# Patient Record
Sex: Male | Born: 1970 | Race: Black or African American | Hispanic: No | Marital: Married | Smoking: Never smoker
Health system: Southern US, Community
[De-identification: ages and names within clinical notes are randomized; demographics above are authoritative.]

## PROBLEM LIST (undated history)

## (undated) ENCOUNTER — Ambulatory Visit: Payer: Medicaid (Managed Care)

## (undated) DIAGNOSIS — R7301 Impaired fasting glucose: Secondary | ICD-10-CM

## (undated) DIAGNOSIS — R635 Abnormal weight gain: Secondary | ICD-10-CM

## (undated) DIAGNOSIS — I10 Essential (primary) hypertension: Secondary | ICD-10-CM

## (undated) DIAGNOSIS — E785 Hyperlipidemia, unspecified: Secondary | ICD-10-CM

## (undated) HISTORY — DX: Hyperlipidemia, unspecified: E78.5

## (undated) HISTORY — DX: Abnormal weight gain: R63.5

## (undated) HISTORY — DX: Impaired fasting glucose: R73.01

## (undated) HISTORY — DX: Essential (primary) hypertension: I10

## (undated) HISTORY — PX: CERVICAL SPINE SURGERY: SHX589

---

## 2011-01-11 ENCOUNTER — Emergency Department (HOSPITAL_BASED_OUTPATIENT_CLINIC_OR_DEPARTMENT_OTHER)
Admission: EM | Admit: 2011-01-11 | Discharge: 2011-01-11 | Disposition: A | Discharge status: Home / Self Care | Payer: Managed Care, Other (non HMO) | Attending: Emergency Medicine | Admitting: Emergency Medicine

## 2011-01-11 ENCOUNTER — Emergency Department (INDEPENDENT_AMBULATORY_CARE_PROVIDER_SITE_OTHER): Payer: Managed Care, Other (non HMO)

## 2011-01-11 DIAGNOSIS — J90 Pleural effusion, not elsewhere classified: Secondary | ICD-10-CM

## 2011-01-11 DIAGNOSIS — I10 Essential (primary) hypertension: Secondary | ICD-10-CM | POA: Insufficient documentation

## 2011-01-11 DIAGNOSIS — Q619 Cystic kidney disease, unspecified: Secondary | ICD-10-CM | POA: Insufficient documentation

## 2011-01-11 DIAGNOSIS — R109 Unspecified abdominal pain: Secondary | ICD-10-CM

## 2011-01-11 DIAGNOSIS — K8020 Calculus of gallbladder without cholecystitis without obstruction: Secondary | ICD-10-CM | POA: Insufficient documentation

## 2011-01-11 DIAGNOSIS — R197 Diarrhea, unspecified: Secondary | ICD-10-CM

## 2011-01-11 LAB — CBC
HCT: 39 % (ref 39.0–52.0)
MCV: 83.7 fL (ref 78.0–100.0)
Platelets: 212 10*3/uL (ref 150–400)
RBC: 4.66 MIL/uL (ref 4.22–5.81)
WBC: 7 10*3/uL (ref 4.0–10.5)

## 2011-01-11 LAB — COMPREHENSIVE METABOLIC PANEL
ALT: 30 U/L (ref 0–53)
AST: 30 U/L (ref 0–37)
Albumin: 4.4 g/dL (ref 3.5–5.2)
Alkaline Phosphatase: 70 U/L (ref 39–117)
GFR calc Af Amer: >60 mL/min (ref >60)
Glucose, Bld: 108 mg/dL — ABNORMAL HIGH (ref 70–99)
Potassium: 4.3 mEq/L (ref 3.5–5.1)
Sodium: 142 mEq/L (ref 135–145)
Total Protein: 8.2 g/dL (ref 6.0–8.3)

## 2011-01-11 LAB — DIFFERENTIAL
Lymphocytes Relative: 35 % (ref 12–46)
Lymphs Abs: 2.5 10*3/uL (ref 0.7–4.0)
Neutrophils Relative %: 50 % (ref 43–77)

## 2011-01-11 LAB — URINALYSIS, ROUTINE W REFLEX MICROSCOPIC
Bilirubin Urine: NEGATIVE
Hgb urine dipstick: NEGATIVE
Nitrite: NEGATIVE
Specific Gravity, Urine: 1.013 (ref 1.005–1.030)
pH: 6.5 (ref 5.0–8.0)

## 2011-01-11 LAB — LIPASE, BLOOD: Lipase: 71 U/L (ref 23–300)

## 2011-01-13 ENCOUNTER — Observation Stay (HOSPITAL_COMMUNITY)
Admission: AD | Admit: 2011-01-13 | Discharge: 2011-01-15 | Disposition: A | Discharge status: Home / Self Care | Payer: Managed Care, Other (non HMO) | Source: Other Acute Inpatient Hospital | Attending: Internal Medicine | Admitting: Internal Medicine

## 2011-01-13 ENCOUNTER — Emergency Department (HOSPITAL_BASED_OUTPATIENT_CLINIC_OR_DEPARTMENT_OTHER)
Admission: EM | Admit: 2011-01-13 | Discharge: 2011-01-13 | Disposition: A | Discharge status: Other Acute Inpatient Hospital | Payer: Managed Care, Other (non HMO) | Attending: Emergency Medicine | Admitting: Emergency Medicine

## 2011-01-13 ENCOUNTER — Emergency Department (INDEPENDENT_AMBULATORY_CARE_PROVIDER_SITE_OTHER): Payer: Managed Care, Other (non HMO)

## 2011-01-13 DIAGNOSIS — I10 Essential (primary) hypertension: Secondary | ICD-10-CM | POA: Insufficient documentation

## 2011-01-13 DIAGNOSIS — N281 Cyst of kidney, acquired: Secondary | ICD-10-CM

## 2011-01-13 DIAGNOSIS — J189 Pneumonia, unspecified organism: Secondary | ICD-10-CM | POA: Insufficient documentation

## 2011-01-13 DIAGNOSIS — R0602 Shortness of breath: Secondary | ICD-10-CM | POA: Insufficient documentation

## 2011-01-13 DIAGNOSIS — R1013 Epigastric pain: Secondary | ICD-10-CM | POA: Insufficient documentation

## 2011-01-13 DIAGNOSIS — R109 Unspecified abdominal pain: Secondary | ICD-10-CM

## 2011-01-13 DIAGNOSIS — M5126 Other intervertebral disc displacement, lumbar region: Secondary | ICD-10-CM | POA: Insufficient documentation

## 2011-01-13 DIAGNOSIS — N200 Calculus of kidney: Secondary | ICD-10-CM | POA: Insufficient documentation

## 2011-01-13 DIAGNOSIS — Z79899 Other long term (current) drug therapy: Secondary | ICD-10-CM | POA: Insufficient documentation

## 2011-01-13 DIAGNOSIS — K8020 Calculus of gallbladder without cholecystitis without obstruction: Secondary | ICD-10-CM | POA: Insufficient documentation

## 2011-01-13 DIAGNOSIS — J90 Pleural effusion, not elsewhere classified: Secondary | ICD-10-CM | POA: Insufficient documentation

## 2011-01-13 DIAGNOSIS — E871 Hypo-osmolality and hyponatremia: Secondary | ICD-10-CM | POA: Insufficient documentation

## 2011-01-13 DIAGNOSIS — R9431 Abnormal electrocardiogram [ECG] [EKG]: Secondary | ICD-10-CM | POA: Insufficient documentation

## 2011-01-13 LAB — CBC
HCT: 38.6 % — ABNORMAL LOW (ref 39.0–52.0)
MCH: 29.3 pg (ref 26.0–34.0)
MCHC: 35 g/dL (ref 30.0–36.0)
MCV: 83.9 fL (ref 78.0–100.0)
Platelets: 208 10*3/uL (ref 150–400)
RDW: 11.9 % (ref 11.5–15.5)

## 2011-01-13 LAB — URINALYSIS, ROUTINE W REFLEX MICROSCOPIC
Bilirubin Urine: NEGATIVE
Hgb urine dipstick: NEGATIVE
Ketones, ur: NEGATIVE mg/dL
Nitrite: NEGATIVE
Urobilinogen, UA: 0.2 mg/dL (ref 0.0–1.0)

## 2011-01-13 LAB — COMPREHENSIVE METABOLIC PANEL
AST: 30 U/L (ref 0–37)
Albumin: 4.4 g/dL (ref 3.5–5.2)
BUN: 17 mg/dL (ref 6–23)
CO2: 28 mEq/L (ref 19–32)
Calcium: 9.5 mg/dL (ref 8.4–10.5)
Chloride: 107 mEq/L (ref 96–112)
Creatinine, Ser: 1.4 mg/dL (ref 0.4–1.5)
GFR calc Af Amer: >60 mL/min (ref >60)
GFR calc non Af Amer: 56 mL/min — ABNORMAL LOW (ref >60)
Total Bilirubin: 0.7 mg/dL (ref 0.3–1.2)

## 2011-01-13 LAB — POCT CARDIAC MARKERS
Myoglobin, poc: 158 ng/mL (ref 12–200)
Myoglobin, poc: 129 ng/mL (ref 12–200)
Troponin i, poc: <0.05 ng/mL (ref 0.00–0.09)
Troponin i, poc: 0.09 ng/mL (ref 0.00–0.09)

## 2011-01-13 LAB — DIFFERENTIAL
Eosinophils Absolute: 0.3 10*3/uL (ref 0.0–0.7)
Eosinophils Relative: 6 % — ABNORMAL HIGH (ref 0–5)
Lymphocytes Relative: 39 % (ref 12–46)
Lymphs Abs: 1.9 10*3/uL (ref 0.7–4.0)
Monocytes Absolute: 0.8 10*3/uL (ref 0.1–1.0)
Monocytes Relative: 17 % — ABNORMAL HIGH (ref 3–12)

## 2011-01-13 LAB — D-DIMER, QUANTITATIVE: D-Dimer, Quant: 0.45 ug/mL-FEU (ref 0.00–0.48)

## 2011-01-13 LAB — URINE MICROSCOPIC-ADD ON

## 2011-01-13 LAB — POCT TOXICOLOGY PANEL: Opiates: POSITIVE

## 2011-01-13 LAB — CARDIAC PANEL(CRET KIN+CKTOT+MB+TROPI): Troponin I: <0.01 ng/mL (ref 0.00–0.06)

## 2011-01-14 ENCOUNTER — Observation Stay (HOSPITAL_COMMUNITY): Payer: Managed Care, Other (non HMO)

## 2011-01-14 LAB — CARDIAC PANEL(CRET KIN+CKTOT+MB+TROPI)
CK, MB: 2.1 ng/mL (ref 0.3–4.0)
CK, MB: 2 ng/mL (ref 0.3–4.0)
Relative Index: 0.7 (ref 0.0–2.5)
Relative Index: 0.7 (ref 0.0–2.5)
Total CK: 285 U/L — ABNORMAL HIGH (ref 7–232)
Troponin I: 0.01 ng/mL (ref 0.00–0.06)

## 2011-01-14 LAB — BASIC METABOLIC PANEL
BUN: 13 mg/dL (ref 6–23)
Chloride: 105 mEq/L (ref 96–112)
GFR calc Af Amer: >60 mL/min (ref >60)
GFR calc non Af Amer: 55 mL/min — ABNORMAL LOW (ref >60)
Potassium: 3.9 mEq/L (ref 3.5–5.1)
Sodium: 141 mEq/L (ref 135–145)

## 2011-01-14 LAB — HEMOGLOBIN A1C: Hgb A1c MFr Bld: 6.2 % — ABNORMAL HIGH (ref <5.7)

## 2011-01-26 NOTE — Discharge Summary (Signed)
Shaun Boyd, METHOT NO.:  [id number]  MEDICAL RECORD NO.:  [id number]           PATIENT TYPE:  I  LOCATION:  1431                         FACILITY:  [facility]  PHYSICIAN:  Marinda Elk, M.D.DATE OF BIRTH:  [birth year]  DATE OF ADMISSION:  01/13/2011 DATE OF DISCHARGE:  01/15/2011                              DISCHARGE SUMMARY   PRIMARY CARE DOCTOR:  Maryelizabeth Rowan, MD  DISCHARGE DIAGNOSES: 1. Flank pain of unclear etiology. 2. Hypertension. 3. Hyponatremia.  DISCHARGE MEDICATIONS: 1. Spironolactone 25 mg p.o. daily. 2. Amlodipine 10 mg p.o. daily. 3. Fish oil 2 caps daily. 4. Vicodin 5/500 one to two tabs q.4 h. p.r.n. 5. Losartan/hydrochlorothiazide 100 mg/25 mg 1 tab daily. 6. Metoprolol XL 100 mg p.o. daily. 7. Zofran 4 mg q.6 h. p.r.n. 8. Tylenol 500 mg 2 tabs p.o. daily. 9. Coenzyme Q10 one tab daily. 10.Multivitamin 1 tab daily. 11.Saw palmetto 2 tabs daily.  PROCEDURES PERFORMED:  MRI of spine shows small central and left paracentral disk protrusion at L5-S1.  Acute abdominal series showed mild right basilar opacity, probable lung atelectasis, airspace disease, pneumonia not entirely excluded.  BRIEF ADMITTING HISTORY AND PHYSICAL:  This is a 40 year old male with past medical history of hypertension who has been in usual state of health, approximately 5-6 days ago  when he started complaining of right- sided pain.  He indicated that his pain is in lower rib cage and infraaxillary region.  He indicates that it was gradually progressing and getting worse.  The pain was radiating to his back as well as across his abdomen.  Coughing and twisting of his extremities made it worse. Two days later, actually, the pain got worse where it was unbearable. He thereby presented to [facility] ED.  He was placed on intravenous fluids.  A CT scan was done which shows small gallstones, discharged with pain medication; but after that, the pain  started coming back with episodes of vomiting, so he came back to the ED and we were asked to admit him and further evaluate.  Please refer to dictation on January 13, 2011, for further details.  PHYSICAL EXAMINATION:  VITAL SIGNS:  Temperature 98, pulse 64, respirations 20, blood pressure 150/93, and he was satting 95% on 2 liters. GENERAL:  He is well nourished, in no obvious distress. HEENT:  Normocephalic and nontraumatic.  Pupils equally round and reactive to light.  Oral mucous membrane is dry. NECK:  Supple.  No JVD.  No bruits or lymphadenopathy.  No palpable cyst. LUNGS:  Good air movement.  Clear to auscultation. CARDIOVASCULAR:  He has positive S1 and S2.  No murmurs, rubs, or gallops. ABDOMEN:  Positive bowel sounds.  Nontender, nondistended, and soft. EXTREMITIES:  Positive pulses.  No clubbing, cyanosis, or edema. NEUROLOGIC:  Nonfocal. MUSCULOSKELETAL:  Nontender, moving all 4 extremities without any difficulty.  D-dimer 0.45.  Labs on admission shows a white count of 4.8 with an ANC of 1.8, hemoglobin of 13, MCV of 83, and platelet count of 208.  His UA shows 0-2 white blood cells, rare bacteria.  Sodium 146, potassium 4.1, chloride 107, bicarb  28, glucose of 106, BUN of 17, and creatinine 1.4. LFTs within normal limits.  His lipase was 74.  His point-of-cardiac markers were negative x2.  His toxicology was positive for opiates.  His D-dimer was 0.45 and imaging as above.  BRIEF HOSPITAL COURSE: 1. Flank pain.  CT scan of the abdomen on February 13 showed small     pleural effusion, adjacent to the diaphragm, some renal cyst and     small stones.  Ultrasound done on February 15 shows cholelithiasis     with no evidence of acute cholecystitis.  LFTs remained normal.     His lipase was normal.  An x-ray showed results as above and an MRI     was done that showed no spinal involvement.  It is unclear the     cause of his pain.  Since he was admitted, the pain has  subsided.     He has not had it for the last 24 hours, so he is discharged in     stable condition and will follow up with his primary care doctor. 2. Hypertension.  His potassium was borderline low.  Due to his hard     to control blood pressure and his diastolic being high, his     potassium was stopped and spironolactone was added.  He needs a     BMET in 2 weeks. 3. Hypernatremia.  This resolved with IV fluids.  DISPOSITION:  The patient will follow up with his primary care doctor in 1 week here.  We will check a BMET and we will check his blood pressure to see if any medication needs titration.  VITAL SIGNS ON DAY OF DISCHARGE:  Temperature 97, pulse 74, respirations 20, blood pressure 145/98, and he was satting 100% on room air.  LABORATORY DATA ON DAY OF DISCHARGE:  His hemoglobin A1c is 6.2. Cardiac enzymes negative x1.  His sodium was 141, potassium 3.9, chloride 105, bicarb 28, glucose of 103, BUN of 13, creatinine 1.4, and calcium 9.1.     Marinda Elk, M.D.     AF/MEDQ  D:  01/15/2011  T:  01/15/2011  Job:  [id number]  cc:   Maryelizabeth Rowan, M.D. Fax: [phone]  Electronically Signed by Lambert Keto M.D. on 01/26/2011 04:08:14 PM

## 2011-02-14 NOTE — H&P (Signed)
NAME:  Shaun Boyd, Shaun Boyd NO.:  [id number]  MEDICAL RECORD NO.:  [id number]           PATIENT TYPE:  I  LOCATION:  1431                         FACILITY:  [facility]  PHYSICIAN:  Marcellus Scott, MD     DATE OF BIRTH:  [birth year]  DATE OF ADMISSION:  01/13/2011 DATE OF DISCHARGE:                             HISTORY & PHYSICAL   PRIMARY CARE PHYSICIAN:  Maryelizabeth Rowan, M.D.  CHIEF COMPLAINTS:  Right "side pain."  HISTORY OF PRESENT ILLNESS:  Shaun Boyd is a pleasant 40 year old African-American male patient with history of hypertension who was in his usual state of health until approximately 5-6 days ago when he started experiencing right "side pain."  He indicates his pain at the right lower rib cage in the infra-axillary region.  He indicates that this was gradual on onset and progressively got worse.  It was a gnawing kind of pain which radiated to the right upper back as well as across the upper abdomen.  Coughing or twisting his trunk sometimes made the pain worse.  Two days later, the pain actually got worse to a 9/10 in severity and he could not tolerate it anymore.  He thereby presented to the [facility].  He was placed on intravenous fluids.  A CT of the abdomen was done, which showed several small gallstones.  He was discharged on pain medications.  The same night, he started having the pain again and had 1 episode of vomiting which consisted of drinks that he had consumed but no coffee grounds or blood.  Since then, the patient indicates that he has been having the pain which sometimes comes on approximately an hour after he has eaten and it has no associated nausea or vomiting.  He has had a normal BM yesterday.  The pain medications relieve his pain for 3-4 hours again to return.  When his wife massaged that part of the rib cage with balm, it felt tender.  He denies lifting any weights or doing any unusual activity or direct trauma.  There is  no history of long-distance travel.  He specifically denies any cough, fever, or chills.  He does complain of some difficulty breathing when the pain is severe.  For these symptoms, he presented back to the [facility] Emergency Department.  He was then sent to the [facility] for the [facility] hospitalist to admit him for further evaluation and management.  Apparently, he also had some EKG changes.  PAST MEDICAL HISTORY: 1. Hypertension. 2. History of echocardiogram and a stress test in 2003 in [place]     which was said to be negative.  PAST SURGICAL HISTORY:  Teeth extraction.  ALLERGIES:  No known drug allergies.  HOME MEDICATIONS: 1. CO-Q-10 over-the-counter 1 tablet p.o. daily. 2. Saw palmetto 2 tablets p.o. daily. 3. Potassium chloride 20 mEq p.o. b.i.d. 4. Metoprolol succinate XL 100 mg p.o. daily. 5. Ondansetron 4 mg p.o. q.6h. p.r.n. for nausea. 6. Hydrocodone/acetaminophen 5/500 mg 1-2 tablets p.o. q.4h. p.r.n.     for pain. 7. Tylenol Extra Strength 500 mg tablet 2 tablets p.o. daily. 8. Fish oil  2 capsules p.o. daily. 9. One a day multivitamin 1 tablet p.o. daily. 10.Amlodipine 10 mg p.o. daily. 11.Losartan/hydrochlorothiazide 100/25 mg 1 tablet p.o. daily.  FAMILY HISTORY:  The patient's mother has history of diabetes and hypertension.  SOCIAL HISTORY:  The patient is married.  He works as a Engineer, civil (consulting).  He is independent of activities of daily living.  There is no history of smoking, alcohol, or drug abuse.  REVIEW OF SYSTEMS:  All systems reviewed and apart from history of presenting illness is negative.  PHYSICAL EXAMINATION:  GENERAL:  Shaun Boyd is a well-built and nourished male patient who is in no obvious distress. VITAL SIGNS:  Temperature 98.3 degrees Fahrenheit, pulse 64 per minute, respirations 20 per minute, blood pressure 150/93 mmHg which was 159/116 on initial arrival to the [facility] ED.  Saturating at 95% on 2 L  of oxygen. HEAD, EYES, AND ENT:  Nontraumatic, normocephalic.  Pupils equally reacting to light and accommodation.  Oral mucosa is mildly dry but otherwise no acute findings. NECK:  Supple.  No JVD or carotid bruit. LYMPHATICS:  No lymphadenopathy. RESPIRATORY SYSTEM:  Clear to auscultation.  No increased work of breathing. CARDIOVASCULAR SYSTEM:  First and second heart sounds heard.  Regular rate and rhythm.  No murmurs, rubs, gallops, or clicks. ABDOMEN:  Nondistended, nontender, and soft.  No organomegaly or mass appreciated.  Bowel sounds are normally heard. CENTRAL NERVOUS SYSTEM:  The patient is awake, alert, and oriented x3 with no focal neurological deficits. EXTREMITIES:  With grade 5/5 power. SKIN:  Without any rashes. MUSCULOSKELETAL SYSTEM:  No focal tenderness in the area of indicated pain at this time.  The patient however indicates that he was tender before he got his pain medications.  LABORATORY DATA:  D-dimer is negative at 0.45.  Point of care cardiac markers are negative.  Urine drug screen is only positive for opiates which he might have received in the emergency department.  Lipase is 74. Comprehensive metabolic panel only significant for sodium 146, glucose of 106, otherwise within normal limits.  Urinalysis is not indicative of urinary tract infections.  CBC is within normal limits.  Abdominal x-ray shows unremarkable bowel gas pattern.  Chest x-ray shows mild right basilar opacity, probably atelectasis but airspace disease or pneumonia not entirely excluded.  Radiologist asked for to correlate with the signs of infection.  Abdominal ultrasound shows 1 cm mobile gallstone but no evidence of gallbladder wall thickening, pericholecystic fluid, or sonographic Murphy sign.  The common bile duct measures 4 mm in greatest diameter, and there is no intrahepatic or extrahepatic biliary dilatation.  The liver is within normal limits.  Right kidney shows no  evidence of solid mass, hydronephrosis, or definite calculi.  The left kidney does not show any solid mass, hydronephrosis, or definite renal calculi.  A 3.6 cm septated cystic lesion within the left pole is identified without definite mural nodularity.  No abdominal aortic aneurysm identified and no evidence of ascites.  His EKG shows sinus rhythm at 65 beats per minute with normal axis, some T-wave inversions in lead V5 and V6 and III and aVF, but otherwise, no acute changes.  There is no previous EKG to compare.  ASSESSMENT AND PLAN: 1. Right flank pain.  Etiology is likely musculoskeletal in origin.     Other less likely causes on differential diagnosis include     gastritis versus peptic ulcer disease, although the patient denies     any previous history and has not been  abusing nonsteroidal anti-     inflammatory drugs versus gallstones, although there are no acute     features on clinical exam or on radiology, and his LFTs look     normal.  Pneumonia is less likely given no symptoms, normal white     cell count, and temperature.  We will admit the patient for 23-hour     observation on telemetry.  We will cycle cardiac enzymes, briefly     hydrate with IV fluids, and provide pain medications and monitor.     He has some EKG changes but do not have a previous EKG to compare     and unclear of the significance of it. 2. Hypertension which was initially uncontrolled possibly from the     pain and not having taken medications today.  We will discontinue     nitro paste and resume his home medications. 3. Mild hyponatremia, possibly from mild dehydration.  We will briefly     hydrate and follow labs tomorrow. 4. Left renal cystic lesion.  For outpatient evaluation as deemed     necessary.  DISPOSITION:  The patient should possibly be able to be discharged once his pain is improved and further workup is negative.  TIME:  Time taken in coordinating this history and physical note is  an hour.     Marcellus Scott, MD     AH/MEDQ  D:  01/13/2011  T:  01/13/2011  Job:  [id number]  cc:   Maryelizabeth Rowan, M.D. Fax: [phone]  Electronically Signed by Marcellus Scott MD on 02/14/2011 06:58:16 PM

## 2012-04-13 IMAGING — US US ABDOMEN COMPLETE
1 series · 13 of 25 positions shown · non-contrast
Comparison: 01/11/2011 CT

CLINICAL DATA: Abdominal pain.

ABDOMINAL ULTRASOUND COMPLETE

[Series 1: us abdomen complete · 0.45mm/px · 13 of 69 slices shown]
[im 1/69]
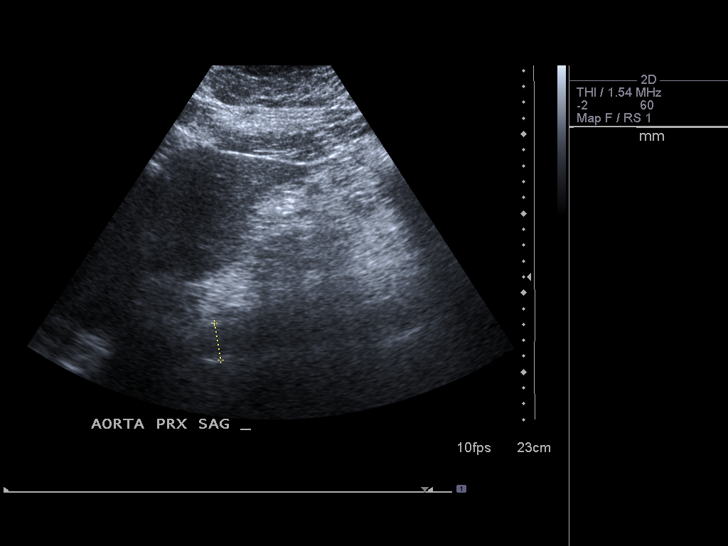
[im 6/69]
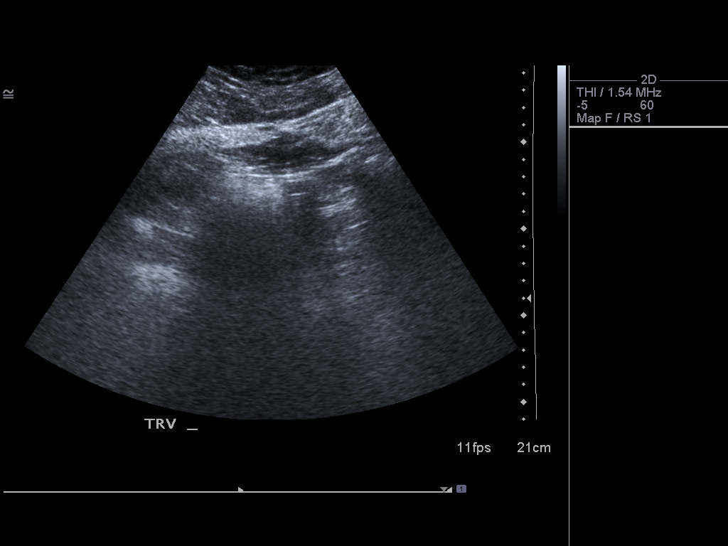
[im 12/69]
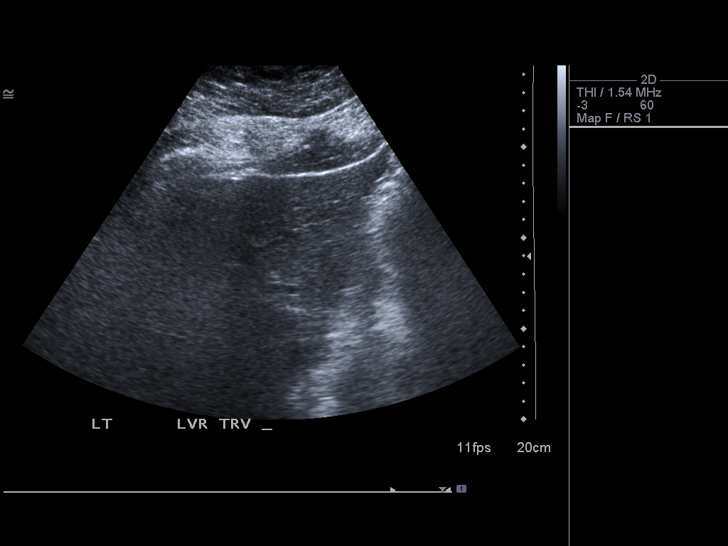
[im 18/69]
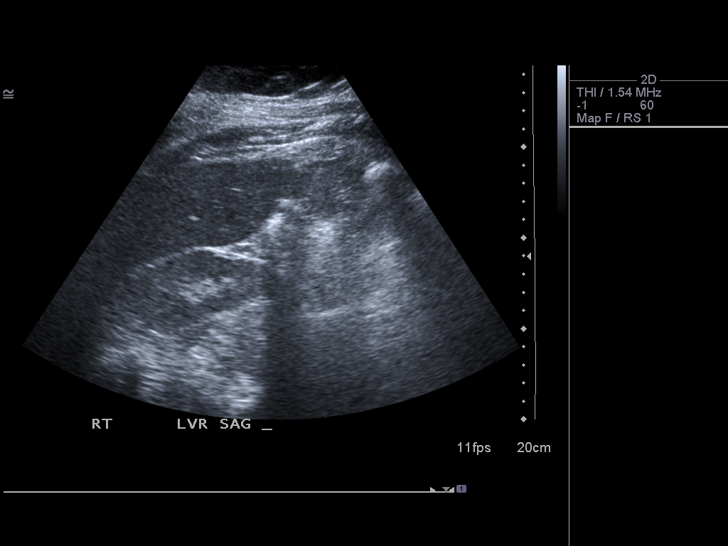
[im 23/69]
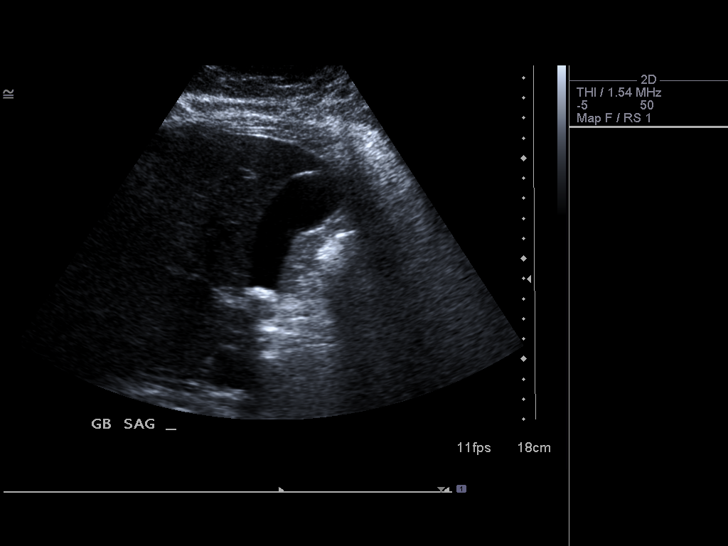
[im 29/69]
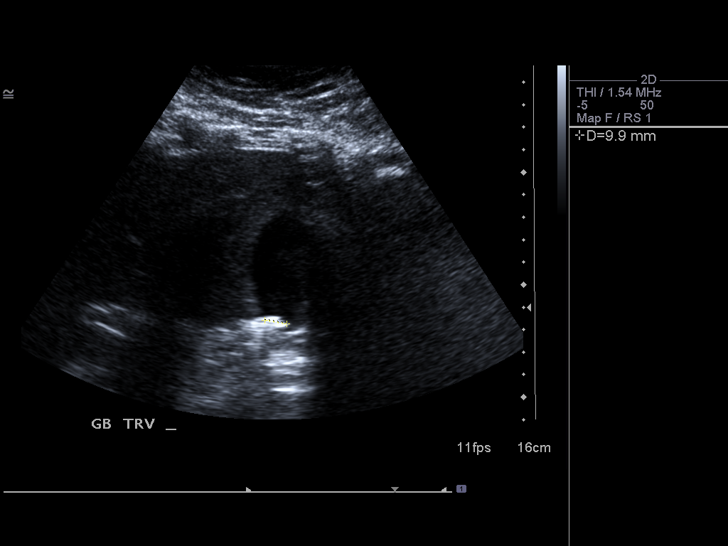
[im 35/69]
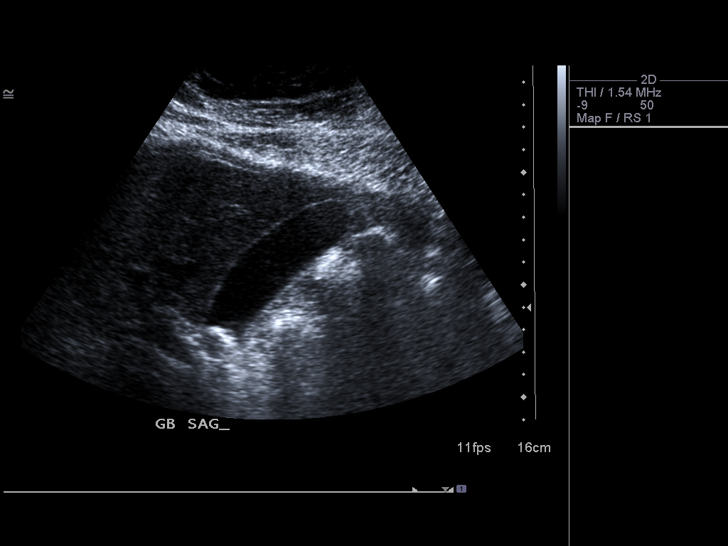
[im 40/69]
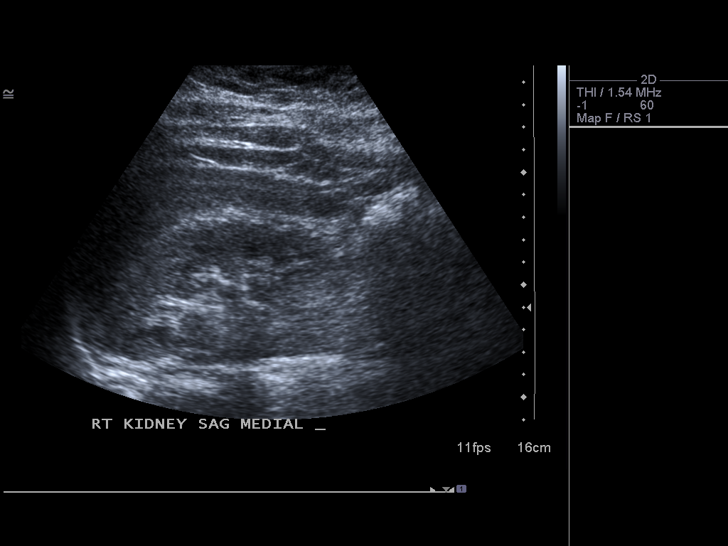
[im 46/69]
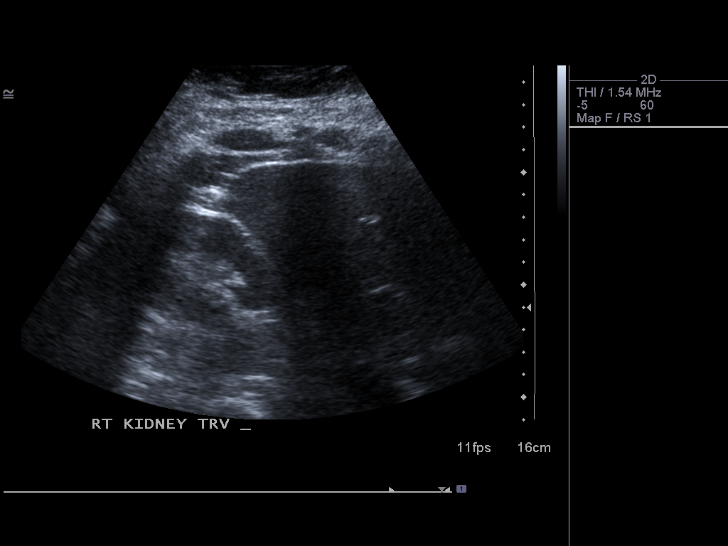
[im 52/69]
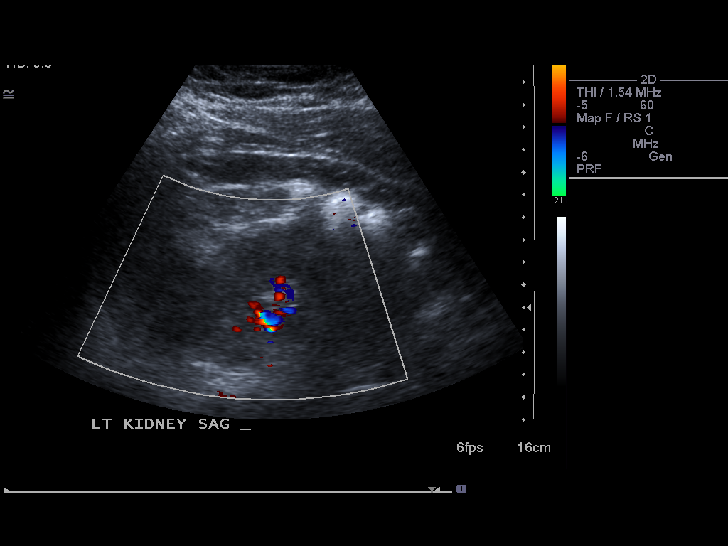
[im 57/69]
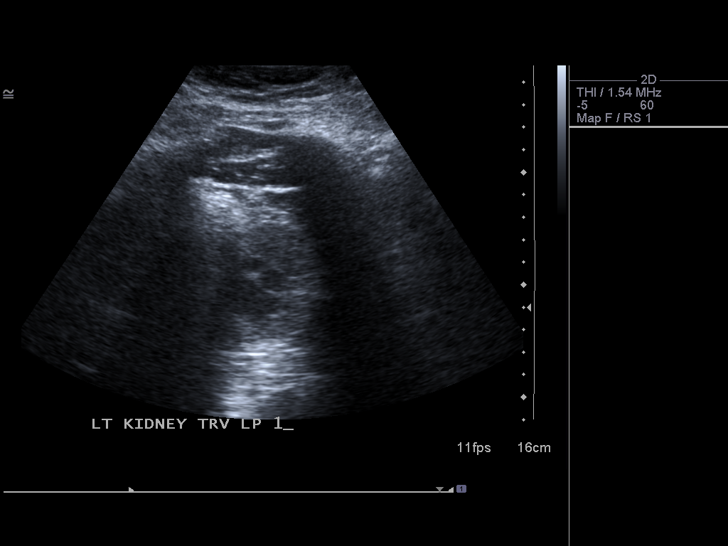
[im 63/69]
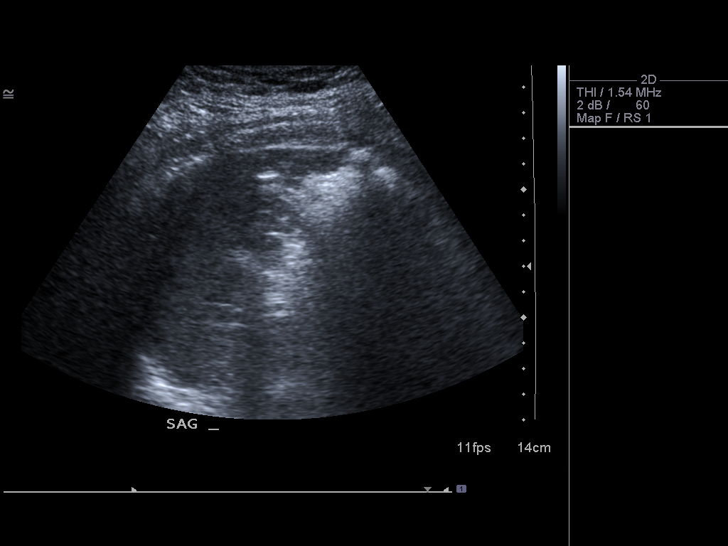
[im 69/69]
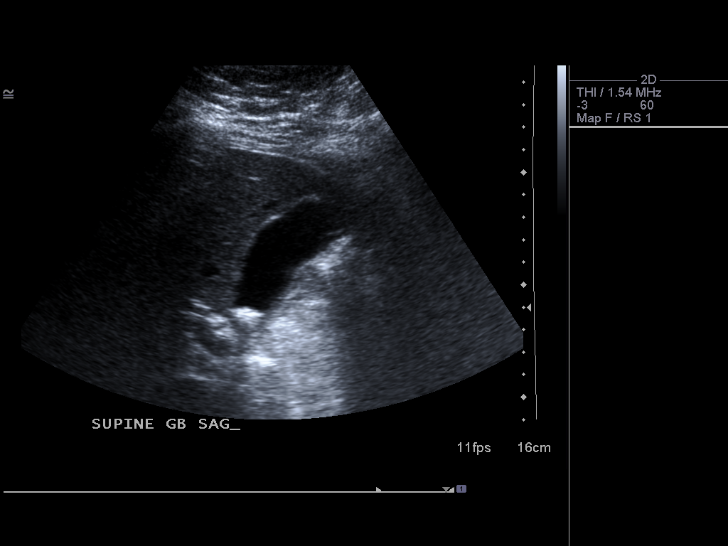

[13 of 25 positions shown; findings below may reference images not displayed]

FINDINGS: Gallbladder:  A 1 cm mobile gallstone is identified.  There is no
evidence of gallbladder wall thickening, pericholecystic fluid or
sonographic Murphy's sign.

Common Bile Duct:  There is no evidence of intrahepatic or
extrahepatic biliary dilation. The CBD measures 4.0 mm in greatest
diameter.

Liver:  The liver is within normal limits in parenchymal
echogenicity. No focal abnormalities are identified.

IVC:  Not well visualized secondary to overlying bowel gas

Pancreas:  Although the pancreas is difficult to visualize in its
entirety, no focal pancreatic abnormality is identified.

Spleen:  Within normal limits in size and echotexture.

Right kidney:  The right kidney is normal in size and parenchymal
echogenicity.  There is no evidence of solid mass, hydronephrosis
or definite renal calculi.  The right kidney measures 11.4 cm.

Left kidney:  The left kidney is normal in size and parenchymal
echogenicity.  There is no evidence of solid mass, hydronephrosis
or definite renal calculi.  A 3 x 3.6 cm septated cystic lesion
within the left lower pole is identified without definite mural
nodularity.
The left kidney measures 11.4 cm.

Abdominal Aorta:  No abdominal aortic aneurysm identified.

There is no evidence of ascites.
IMPRESSION: Cholelithiasis without evidence of acute
cholecystitis. No evidence of biliary dilatation.

3 x 3.6 cm left lower renal cystic lesion.  This would be
characterized as a Bosniak IIF cyst by ultrasound criteria and
ultrasound follow-up in 6 months is recommended.  If more complete
and likely more definitive evaluation is desired then elective MRI
with and without contrast is suggested.

## 2014-09-12 ENCOUNTER — Encounter: Payer: Self-pay | Admitting: *Deleted

## 2014-09-16 ENCOUNTER — Institutional Professional Consult (permissible substitution): Payer: Self-pay | Admitting: Neurology

## 2014-09-16 ENCOUNTER — Telehealth: Payer: Self-pay | Admitting: Neurology

## 2014-09-16 NOTE — Telephone Encounter (Signed)
Patient no showed for an appointment today, 09/16/2014, at 0900.

## 2014-09-17 ENCOUNTER — Institutional Professional Consult (permissible substitution): Payer: Commercial Indemnity | Admitting: Neurology

## 2014-10-02 ENCOUNTER — Encounter: Payer: Self-pay | Admitting: Neurology

## 2014-10-02 ENCOUNTER — Ambulatory Visit (INDEPENDENT_AMBULATORY_CARE_PROVIDER_SITE_OTHER): Payer: Commercial Indemnity | Admitting: Neurology

## 2014-10-02 VITALS — BP 145/95 | HR 109 | Temp 98.1°F | Ht 69.0 in | Wt 282.0 lb

## 2014-10-02 DIAGNOSIS — J988 Other specified respiratory disorders: Secondary | ICD-10-CM

## 2014-10-02 DIAGNOSIS — R0683 Snoring: Secondary | ICD-10-CM

## 2014-10-02 DIAGNOSIS — R030 Elevated blood-pressure reading, without diagnosis of hypertension: Secondary | ICD-10-CM

## 2014-10-02 NOTE — Progress Notes (Signed)
Subjective:    Patient ID: Gordy ClementRicardo Ferrante is a 43 y.o. male.  HPI     Huston FoleySaima Marquel Spoto, MD, PhD [facility] [address], [address] [address] [place], [place] [address]  Dear Vonna KotykJay,   I saw your patient, Gordy ClementRicardo Cerney, upon your kind request in my neurologic clinic today for initial consultation of his sleep disturbance, in particular, concern for underlying obstructive sleep apnea. The patient is unaccompanied today. Of note, he no showed for an appointment on 09/16/2014. As you know, Mr. Alden Serverrnest is a very friendly 43 year old right-handed gentleman with an underlying medical history of obesity, hypertension, impaired fasting glucose, and hyperlipidemia, who reports snoring and recent increases in blood pressure values. You felt that he needed to be tested for underlying sleep apnea.  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   His typical bedtime is reported to be around 10 PM and usual wake time is around 6:45 AM. Sleep onset typically occurs after 30-90 minutes. He reports feeling adequately rested upon awakening. He wakes up on an average 1 times in the middle of the night and has to go to the bathroom 0 to 1 times on a typical night. He denies morning headaches.  He reports feeling tired at times. His Epworth Sleepiness Score (ESS) is 7/24 today. He has not fallen asleep while driving. The patient has not been taking a scheduled nap.  He has a 43 yo and 311 yo son.  He has been known to snore for the past many years. Snoring is reportedly marked, and not overtly associated with choking sounds and witnessed apneas. The patient denies a sense of choking or strangling feeling. There is no report of nighttime reflux, with no nighttime cough experienced. The patient has not noted any RLS symptoms and is not known to kick while asleep or before falling asleep.  There is no family history of RLS or OSA.  He denies cataplexy, sleep paralysis, hypnagogic or hypnopompic  hallucinations, or sleep attacks. He does not report any vivid dreams, nightmares, dream enactments, or parasomnias, such as sleep talking or sleep walking. The patient has not had a sleep study or a home sleep test.  He consumes 2 caffeinated beverages per day, usually in the form of coffee, sodas, tea, green tea.  His bedroom is usually dark and cool. There is TV in the bedroom and usually it is not on at night.   His Past Medical History Is Significant For: Past Medical History  Diagnosis Date  . Hyperlipidemia   . Abnormal weight gain   . Benign essential hypertension   . Impaired fasting glucose     His Past Surgical History Is Significant For: History reviewed. No pertinent past surgical history.  His Family History Is Significant For: Family History  Problem Relation Age of Onset  . Hypertension Mother     His Social History Is Significant For: History   Social History  . Marital Status: Married    Spouse Name: Transport plannerKavor    Number of Children: 2  . Years of Education: BS   Occupational History  .      [employer]   Social History Main Topics  . Smoking status: Never Smoker   . Smokeless tobacco: Never Used  . Alcohol Use: 0.0 oz/week    0 Not specified per week     Comment: occ  . Drug Use: No  . Sexual Activity: None   Other Topics Concern  . None  Social History Narrative   Patient consumes 1-2 cups of caffeine daily, is right handed    His Allergies Are:  Allergies  Allergen Reactions  . Shellfish Allergy Shortness Of Breath and Swelling  :   His Current Medications Are:  Outpatient Encounter Prescriptions as of 10/02/2014  Medication Sig  . amLODipine (NORVASC) 10 MG tablet Take 10 mg by mouth daily.   Marland Kitchen. aspirin 81 MG tablet Take 81 mg by mouth daily.  . Cholecalciferol (D-3-5) 5000 UNITS capsule Take 5,000 Units by mouth daily.  . Coenzyme Q10 (CO Q-10) 100 MG CAPS Take 100 mg by mouth daily.  Marland Kitchen. losartan-hydrochlorothiazide (HYZAAR) 100-25 MG  per tablet Take 1 tablet by mouth daily.  Marland Kitchen. METOPROLOL SUCCINATE ER PO Take 200 mg by mouth daily.  . Multiple Vitamins-Minerals (ONE-A-DAY MENS VITACRAVES) CHEW Chew 1 each by mouth daily.  Marland Kitchen. olmesartan-hydrochlorothiazide (BENICAR HCT) 40-25 MG per tablet Take 1 tablet by mouth.  . sildenafil (VIAGRA) 100 MG tablet Take 100 mg by mouth daily as needed for erectile dysfunction.  . vitamin E 400 UNIT capsule Take 400 Units by mouth daily.  . [DISCONTINUED] cloNIDine (CATAPRES) 0.1 MG tablet Take 0.1 mg by mouth 2 (two) times daily.  . [DISCONTINUED] valsartan-hydrochlorothiazide (DIOVAN-HCT) 160-25 MG per tablet Take 1 tablet by mouth daily.  :  Review of Systems:  Out of a complete 14 point review of systems, all are reviewed and negative with the exception of these symptoms as listed below:   Review of Systems  Neurological:       Snoring    Objective:  Neurologic Exam  Physical Exam Physical Examination:   Filed Vitals:   10/02/14 0826  BP: 145/95  Pulse: 109  Temp:     General Examination: The patient is a very pleasant 43 y.o. male in no acute distress. He appears well-developed and well-nourished and well groomed.   HEENT: Normocephalic, atraumatic, pupils are equal, round and reactive to light and accommodation. Funduscopic exam is normal with sharp disc margins noted. Extraocular tracking is good without limitation to gaze excursion or nystagmus noted. Normal smooth pursuit is noted. Hearing is grossly intact. Tympanic membranes are clear bilaterally. Face is symmetric with normal facial animation and normal facial sensation. Speech is clear with no dysarthria noted. There is no hypophonia. There is no lip, neck/head, jaw or voice tremor. Neck is supple with full range of passive and active motion. There are no carotid bruits on auscultation. Oropharynx exam reveals: mild mouth dryness, good dental hygiene and marked airway crowding, due to large tongue, wider uvula and  tonsils in place. Mallampati is class II. Tongue protrudes centrally and palate elevates symmetrically. Tonsils are 1+ in size. Neck size is 17 7/8 inches. He has an underbite. Nasal inspection reveals no significant nasal mucosal bogginess or redness and no septal deviation.   Chest: Clear to auscultation without wheezing, rhonchi or crackles noted.  Heart: S1+S2+0, regular and normal without murmurs, rubs or gallops noted.   Abdomen: Soft, non-tender and non-distended with normal bowel sounds appreciated on auscultation.  Extremities: There is no pitting edema in the distal lower extremities bilaterally. Pedal pulses are intact.  Skin: Warm and dry without trophic changes noted. There are no varicose veins.  Musculoskeletal: exam reveals no obvious joint deformities, tenderness or joint swelling or erythema.   Neurologically:  Mental status: The patient is awake, alert and oriented in all 4 spheres. His immediate and remote memory, attention, language skills and fund of knowledge are  appropriate. There is no evidence of aphasia, agnosia, apraxia or anomia. Speech is clear with normal prosody and enunciation. Thought process is linear. Mood is normal and affect is normal.  Cranial nerves II - XII are as described above under HEENT exam. In addition: shoulder shrug is normal with equal shoulder height noted. Motor exam: Normal bulk, strength and tone is noted. There is no drift, tremor or rebound. Romberg is negative. Reflexes are 2+ throughout. Babinski: Toes are flexor bilaterally. Fine motor skills and coordination: intact with normal finger taps, normal hand movements, normal rapid alternating patting, normal foot taps and normal foot agility.  Cerebellar testing: No dysmetria or intention tremor on finger to nose testing. Heel to shin is unremarkable bilaterally. There is no truncal or gait ataxia.  Sensory exam: intact to light touch, pinprick, vibration, temperature sense in the upper and  lower extremities.  Gait, station and balance: He stands easily. No veering to one side is noted. No leaning to one side is noted. Posture is age-appropriate and stance is narrow based. Gait shows normal stride length and normal pace. No problems turning are noted. He turns en bloc. Tandem walk is unremarkable. Intact toe and heel stance is noted.              Assessment and Plan:   In summary, Sahir Tolson is a very pleasant 43 y.o.-year old male with an underlying medical history of obesity, hypertension, impaired fasting glucose, and hyperlipidemia, with a history and physical exam concerning for obstructive sleep apnea (OSA). Especially in light of his rather tight looking airway I'm concerned that he has significant underlying OSA which could be a contributor to his blood pressure increases. I had a long chat with the patient about my findings and the diagnosis of OSA, its prognosis and treatment options. We talked about medical treatments, surgical interventions and non-pharmacological approaches. I explained in particular the risks and ramifications of untreated moderate to severe OSA, especially with respect to developing cardiovascular disease down the Road, including congestive heart failure, difficult to treat hypertension, cardiac arrhythmias, or stroke. Even type 2 diabetes has, in part, been linked to untreated OSA. Symptoms of untreated OSA include daytime sleepiness, memory problems, mood irritability and mood disorder such as depression and anxiety, lack of energy, as well as recurrent headaches, especially morning headaches. We talked about trying to maintain a healthy lifestyle in general, as well as the importance of weight control. I encouraged the patient to eat healthy, exercise daily and keep well hydrated, to keep a scheduled bedtime and wake time routine, to not skip any meals and eat healthy snacks in between meals. I advised the patient not to drive when feeling sleepy. I  recommended the following at this time: sleep study with potential positive airway pressure titration. (We will score hypopneas at 3% and split the sleep study into diagnostic and treatment portion, if the estimated. 2 hour AHI is >15/h or as required by his insurance).   I explained the sleep test procedure to the patient and also outlined possible surgical and non-surgical treatment options of OSA, including the use of a custom-made dental device (which would require a referral to a specialist dentist or oral surgeon), upper airway surgical options, such as pillar implants, radiofrequency surgery, tongue base surgery, and UPPP (which would involve a referral to an ENT surgeon). Rarely, jaw surgery such as mandibular advancement may be considered.  I also explained the CPAP treatment option to the patient, who indicated that he would  be willing to try CPAP if the need arises. I explained the importance of being compliant with PAP treatment, not only for insurance purposes but primarily to improve His symptoms, and for the patient's long term health benefit, including to reduce His cardiovascular risks. I answered all his questions today and the patient was in agreement. I would like to see him back after the sleep study is completed and encouraged him to call with any interim questions, concerns, problems or updates.   Thank you very much for allowing me to participate in the care of this nice patient. If I can be of any further assistance to you please do not hesitate to call me at [phone].  Sincerely,   Star Age, MD, PhD

## 2014-10-02 NOTE — Patient Instructions (Signed)

## 2017-07-29 DIAGNOSIS — E669 Obesity, unspecified: Secondary | ICD-10-CM | POA: Insufficient documentation

## 2018-08-25 DIAGNOSIS — I10 Essential (primary) hypertension: Secondary | ICD-10-CM | POA: Insufficient documentation

## 2018-12-08 DIAGNOSIS — E7800 Pure hypercholesterolemia, unspecified: Secondary | ICD-10-CM | POA: Insufficient documentation

## 2019-04-06 ENCOUNTER — Emergency Department
Admission: EM | Admit: 2019-04-06 | Discharge: 2019-04-07 | Disposition: A | Discharge status: Home / Self Care | Payer: No Typology Code available for payment source | Source: Ambulatory Visit | Attending: Emergency Medicine | Admitting: Emergency Medicine

## 2019-04-06 DIAGNOSIS — M2550 Pain in unspecified joint: Secondary | ICD-10-CM

## 2019-04-06 DIAGNOSIS — K625 Hemorrhage of anus and rectum: Secondary | ICD-10-CM | POA: Insufficient documentation

## 2019-04-06 DIAGNOSIS — K648 Other hemorrhoids: Secondary | ICD-10-CM

## 2019-04-06 DIAGNOSIS — G8929 Other chronic pain: Secondary | ICD-10-CM

## 2019-04-06 HISTORY — DX: Essential (primary) hypertension: I10

## 2019-04-06 LAB — HM HIV SCREENING OFFERED

## 2019-04-06 NOTE — ED Triage Notes (Signed)
Pt to ED with complaint of blood in stool noticed by his wife as she was rushing him out of the bathroom. Pt denies any abd pain, appears in NAD.     Triage Note   Lavone Orn, RN

## 2019-04-06 NOTE — ED Provider Notes (Signed)
History     Chief Complaint   Patient presents with    Rectal Bleeding     48 yr old M with hx of HTN presents with one episode of BRBPR. Pt states he was moving his bowels , had a large solid stool and noticed some bright blood in the toilet bowl. He has not noticed any bleeding since. He denies any abdominal pain, states he has been feeling well. He states his wife urged him to come in. He has no hx of GI bleeding, not on anticoagulation.              Medical/Surgical/Family History     Past Medical History:   Diagnosis Date    Hypertension         There is no problem list on file for this patient.           Past Surgical History:   Procedure Laterality Date    CERVICAL SPINE SURGERY       No family history on file.       Social History     Tobacco Use    Smoking status: Never Smoker    Smokeless tobacco: Never Used   Substance Use Topics    Alcohol use: Yes     Comment: occasionally    Drug use: Never     Living Situation     Questions Responses    Patient lives with Spouse    Homeless No    Caregiver for other family member     External Services     Employment Employed    Domestic Violence Risk                 Review of Systems   Review of Systems   Constitutional: Negative for chills and fever.   HENT: Negative for congestion, nosebleeds and rhinorrhea.    Respiratory: Negative for chest tightness and shortness of breath.    Gastrointestinal: Positive for blood in stool. Negative for abdominal pain, diarrhea, nausea and vomiting.   Genitourinary: Negative.    Musculoskeletal: Positive for arthralgias (chronic joint pains). Negative for back pain.   Neurological: Negative for dizziness, weakness and light-headedness.   Hematological: Does not bruise/bleed easily.   All other systems reviewed and are negative.      Physical Exam     Triage Vitals  Triage Start: Start, (04/06/19 2327)   First Recorded BP: (!) 140/96, Resp: 16, Temp: 37.2 C (99 F), Temp src: TEMPORAL Oxygen Therapy SpO2: 99 %, Oximetry  Source: Rt Hand, O2 Device: None (Room air), Heart Rate: 96, (04/06/19 2332)  .      Physical Exam  Vitals signs and nursing note reviewed.   Constitutional:       Appearance: Normal appearance.   HENT:      Head: Normocephalic and atraumatic.      Right Ear: External ear normal.      Left Ear: External ear normal.      Nose: Nose normal.      Mouth/Throat:      Mouth: Mucous membranes are moist.   Eyes:      Extraocular Movements: Extraocular movements intact.      Pupils: Pupils are equal, round, and reactive to light.   Neck:      Musculoskeletal: Normal range of motion.   Cardiovascular:      Rate and Rhythm: Normal rate and regular rhythm.      Pulses: Normal pulses.  Heart sounds: Normal heart sounds.   Pulmonary:      Effort: Pulmonary effort is normal.      Breath sounds: Normal breath sounds.   Abdominal:      General: Abdomen is flat.      Tenderness: There is no abdominal tenderness.   Genitourinary:     Rectum: Internal hemorrhoid present. No tenderness, anal fissure or external hemorrhoid.      Comments: There is no gross blood on exam  Musculoskeletal: Normal range of motion.   Skin:     General: Skin is warm and dry.      Capillary Refill: Capillary refill takes less than 2 seconds.   Neurological:      General: No focal deficit present.      Mental Status: He is alert and oriented to person, place, and time.   Psychiatric:         Mood and Affect: Mood normal.         Behavior: Behavior normal.         Medical Decision Making   Patient seen by me on:  04/06/2019    Assessment:  48 yr old M presents with one episode of brbpr during a large bowel movement, No associated complaints    Differential diagnosis:  Hemorrhoidal bleeding. LGI bleed    Plan:  Suspect transient hemorrhoidal bleed, labs and then likely d/c with GI referral     Independent review of: Existing labs    ED Course and Disposition:  Labs Reviewed  CBC AND DIFFERENTIAL - Abnormal; Notable for the following components:     RBC                            4.3 (*)                Hemoglobin                    13.1 (*)               Hematocrit                    38 (*)                 Neut # K/uL                   1.7 (*)             All other components within normal limits  COMPREHENSIVE METABOLIC PANEL - Abnormal; Notable for the following components:     Potassium                     3.0 (*)                Creatinine                    1.22 (*)               Glucose                       134 (*)                AST                           60 (*)  All other components within normal limits  PROTIME-INR  OCCULT BLOOD X 1, STOOL  TYPE AND SCREEN      Occult blood is neg, will refer to gastroenterology for further workup as indicated. Results discussed with pt, states his potassium has been low- ? 2/2 to hctz, he will follow up with his PMD. Return precautions discussed.              Verl Dicker, DO          Verl Dicker, DO  04/07/19 0113

## 2019-04-07 LAB — CBC AND DIFFERENTIAL
Baso # K/uL: 0 10*3/uL (ref 0.0–0.1)
Basophil %: 0.7 %
Eos # K/uL: 0.2 10*3/uL (ref 0.0–0.5)
Eosinophil %: 4.2 %
Hematocrit: 38 % — ABNORMAL LOW (ref 40–51)
Hemoglobin: 13.1 g/dL — ABNORMAL LOW (ref 13.7–17.5)
IMM Granulocytes #: 0 10*3/uL (ref 0.0–0.0)
IMM Granulocytes: 0 %
Lymph # K/uL: 2 10*3/uL (ref 1.3–3.6)
Lymphocyte %: 44.7 %
MCH: 30 pg/cell (ref 26–32)
MCHC: 35 g/dL (ref 32–37)
MCV: 87 fL (ref 79–92)
Mono # K/uL: 0.6 10*3/uL (ref 0.3–0.8)
Monocyte %: 12.6 %
Neut # K/uL: 1.7 10*3/uL — ABNORMAL LOW (ref 1.8–5.4)
Nucl RBC # K/uL: 0 10*3/uL (ref 0.0–0.0)
Nucl RBC %: 0 /100 WBC (ref 0.0–0.2)
Platelets: 262 10*3/uL (ref 150–330)
RBC: 4.3 MIL/uL — ABNORMAL LOW (ref 4.6–6.1)
RDW: 12.4 % (ref 11.6–14.4)
Seg Neut %: 37.8 %
WBC: 4.5 10*3/uL (ref 4.2–9.1)

## 2019-04-07 LAB — PROTIME-INR
INR: 1 (ref 0.9–1.1)
Protime: 11.7 s (ref 10.0–12.9)

## 2019-04-07 LAB — COMPREHENSIVE METABOLIC PANEL
ALT: 49 U/L (ref 0–50)
AST: 60 U/L — ABNORMAL HIGH (ref 0–50)
Albumin: 4.1 g/dL (ref 3.5–5.2)
Alk Phos: 67 U/L (ref 40–130)
Anion Gap: 13 (ref 7–16)
Bilirubin,Total: 0.2 mg/dL (ref 0.0–1.2)
CO2: 28 mmol/L (ref 20–28)
Calcium: 9.2 mg/dL (ref 8.6–10.2)
Chloride: 100 mmol/L (ref 96–108)
Creatinine: 1.22 mg/dL — ABNORMAL HIGH (ref 0.67–1.17)
GFR,Black: 81 *
GFR,Caucasian: 70 *
Glucose: 134 mg/dL — ABNORMAL HIGH (ref 60–99)
Lab: 17 mg/dL (ref 6–20)
Potassium: 3 mmol/L — ABNORMAL LOW (ref 3.4–4.7)
Sodium: 141 mmol/L (ref 133–145)
Total Protein: 7.3 g/dL (ref 6.3–7.7)

## 2019-04-07 LAB — OCCULT BLOOD X 1, STOOL: Occult Blood 1: NEGATIVE

## 2019-04-07 LAB — TYPE AND SCREEN
ABO RH Blood Type: O POS
Antibody Screen: NEGATIVE

## 2019-04-07 NOTE — Discharge Instructions (Addendum)
Follow up with your PMD re: your potassium and blood pressure medications

## 2019-11-15 ENCOUNTER — Ambulatory Visit: Payer: Medicaid (Managed Care)

## 2019-11-15 ENCOUNTER — Other Ambulatory Visit
Admission: RE | Admit: 2019-11-15 | Discharge: 2019-11-15 | Disposition: A | Discharge status: Home / Self Care | Payer: Medicaid (Managed Care) | Source: Ambulatory Visit | Attending: Gastroenterology | Admitting: Gastroenterology

## 2019-11-15 DIAGNOSIS — Z20828 Contact with and (suspected) exposure to other viral communicable diseases: Secondary | ICD-10-CM | POA: Insufficient documentation

## 2019-11-16 LAB — COVID-19 NAAT (PCR): COVID-19 NAAT (PCR): NEGATIVE

## 2019-11-16 LAB — COVID-19 PCR

## 2019-11-19 ENCOUNTER — Telehealth: Payer: Self-pay | Admitting: Gastroenterology

## 2019-11-19 NOTE — Telephone Encounter (Signed)
Pre procedure call made to Darren Owens for COLONOSCOPY scheduled on 11/19/2019 with Dr. Birder Robson. Informed patient to arrive at department at 0830AM             The following information was confirmed:     1. Patient's arrival time and date   2. Parking information/location given to patient  3. Directions to department given  4. Patient to bring the following: Insurance card, Photo ID, Pre procedure medication list, Health History form  5. Any further questions, instructed to reach [place] at [phone], Monday thru Friday, 7:30am to 5:00pm.      Your driver that will stay with you and drive you home when discharged. Driver:Spouse    6. Does patient have a pacemaker?  No      7. Does patient take any blood thinners, blood pressure medication or insulin? Yes, Patient instructed that if their doctor did not give them special instructions regarding how much and when to take these medications, they should contact their doctor immediately for instructions.Marland Kitchen

## 2019-11-20 ENCOUNTER — Encounter: Payer: Self-pay | Admitting: Gastroenterology

## 2019-11-20 ENCOUNTER — Encounter
Admission: RE | Disposition: A | Discharge status: Home / Self Care | Payer: Self-pay | Source: Ambulatory Visit | Attending: Gastroenterology

## 2019-11-20 ENCOUNTER — Ambulatory Visit
Admission: RE | Admit: 2019-11-20 | Discharge: 2019-11-20 | Disposition: A | Discharge status: Home / Self Care | Payer: Medicaid (Managed Care) | Source: Ambulatory Visit | Attending: Gastroenterology | Admitting: Gastroenterology

## 2019-11-20 DIAGNOSIS — K648 Other hemorrhoids: Secondary | ICD-10-CM | POA: Insufficient documentation

## 2019-11-20 DIAGNOSIS — Z1211 Encounter for screening for malignant neoplasm of colon: Secondary | ICD-10-CM

## 2019-11-20 DIAGNOSIS — D122 Benign neoplasm of ascending colon: Secondary | ICD-10-CM | POA: Insufficient documentation

## 2019-11-20 DIAGNOSIS — K625 Hemorrhage of anus and rectum: Secondary | ICD-10-CM | POA: Insufficient documentation

## 2019-11-20 DIAGNOSIS — D128 Benign neoplasm of rectum: Secondary | ICD-10-CM | POA: Insufficient documentation

## 2019-11-20 HISTORY — PX: PR COLONOSCOPY FLX DX W/COLLJ SPEC WHEN PFRMD: 45378

## 2019-11-20 SURGERY — COLONOSCOPY

## 2019-11-20 MED ORDER — DEXTROSE 5 % FLUSH FOR PUMPS *I*
0.0000 mL/h | INTRAVENOUS | Status: DC | PRN
Start: 2019-11-20 — End: 2019-11-20

## 2019-11-20 MED ORDER — SODIUM CHLORIDE 0.9 % IV SOLN WRAPPED *I*
50.0000 mL/h | Status: DC
Start: 2019-11-20 — End: 2019-11-20
  Administered 2019-11-20: 50 mL/h via INTRAVENOUS

## 2019-11-20 MED ORDER — SODIUM CHLORIDE 0.9 % FLUSH FOR PUMPS *I*
0.0000 mL/h | INTRAVENOUS | Status: DC | PRN
Start: 2019-11-20 — End: 2019-11-20

## 2019-11-20 MED ORDER — MIDAZOLAM HCL 5 MG/5ML IJ SOLN *I*
INTRAMUSCULAR | Status: AC
Start: 2019-11-20 — End: 2019-11-20
  Filled 2019-11-20: qty 10

## 2019-11-20 MED ORDER — FENTANYL CITRATE 50 MCG/ML IJ SOLN *WRAPPED*
INTRAMUSCULAR | Status: AC
Start: 2019-11-20 — End: 2019-11-20
  Filled 2019-11-20: qty 2

## 2019-11-20 MED ORDER — FENTANYL CITRATE 50 MCG/ML IJ SOLN *WRAPPED*
INTRAMUSCULAR | Status: AC | PRN
Start: 2019-11-20 — End: 2019-11-20
  Administered 2019-11-20: 100 ug via INTRAVENOUS

## 2019-11-20 MED ORDER — MIDAZOLAM HCL 1 MG/ML IJ SOLN *I* WRAPPED
INTRAMUSCULAR | Status: AC | PRN
Start: 2019-11-20 — End: 2019-11-20
  Administered 2019-11-20: 2 mg via INTRAVENOUS
  Administered 2019-11-20: 1 mg via INTRAVENOUS
  Administered 2019-11-20: 2 mg via INTRAVENOUS

## 2019-11-20 SURGICAL SUPPLY — 20 items
BINDER ABD POS COLONOSCOPY REG 100CM (Supply) IMPLANT
CAPTURA PRO FORCEPS WITH SPIKE (Other) ×2 IMPLANT
CLIP HEMOSTASIS ENDO INSTINCT 7FRX230CM (Supply) IMPLANT
COLOWRAP LARGE (Supply) IMPLANT
COLOWRAP REGULAR (Supply)
ELECTRODE ADULT POLYHESIVE PAT RETURN (Supply) IMPLANT
FORCEPS BIOPSY RADIAL JAW LG (Other) IMPLANT
FORCEPS DISPOSABLE ALLIGATOR JAW W/NEEDLE (Supply) IMPLANT
PROBE CIRCUMFERENTAIL FIRE (Supply) IMPLANT
PROBE COAG 2000A 6.9FR L2.2M INTEGR STYL W/ FLTR FOR FLX ENDOSCP INTERVENTION FIAPC (Supply) IMPLANT
PROBE COAG 6.9FR L2.2M CNVNENT BLT IN FLTR 2200SC SIMPLIFIED SETUP PLUG PLAY FUNCTIONALITY (Supply) IMPLANT
PROBE SIDE FIRE (Supply)
PROBE STRAIGHT FIRE (Supply)
SNARE MICRO MINI (Supply)
SNARE MINI SOFT ACU 1.5 X 3 (Supply) IMPLANT
SNARE POLYP 1CMX1.5CM SFT GI MINI MIC OVL ACUSNR (Supply) IMPLANT
SNARE SOFT JUMBO ENDO DISPO (Supply) IMPLANT
SNARE STANDARD SOFT ACU 2.5 X 5.5 (Supply) IMPLANT
TRAP POLYP MULTICHAMBER DISP OPTIMIZER (Other) IMPLANT
TRAPS POLY (Other)

## 2019-11-20 NOTE — Preop H&P (Signed)
OUTPATIENT PRE-PROCEDURE H&P    Chief Complaint / Indications for Procedure: Rectal bleeding    Past Medical History:     Past Medical History:   Diagnosis Date    Hypertension      Past Surgical History:   Procedure Laterality Date    CERVICAL SPINE SURGERY       History reviewed. No pertinent family history.  Social History     Socioeconomic History    Marital status: Married     Spouse name: Not on file    Number of children: Not on file    Years of education: Not on file    Highest education level: Not on file   Tobacco Use    Smoking status: Never Smoker    Smokeless tobacco: Never Used   Substance and Sexual Activity    Alcohol use: Yes     Comment: occasionally    Drug use: Never    Sexual activity: Yes     Partners: Female   Other Topics Concern    Not on file   Social History Narrative    Not on file       Allergies:    Allergies   Allergen Reactions    Shellfish-Derived Products Anaphylaxis       Medications:  Medications Prior to Admission   Medication Sig    aspirin (ASPIRIN EC) 81 MG EC tablet Take 81 mg by mouth daily    cloNIDine (CATAPRES) 0.1 MG tablet Take 0.1 mg by mouth 2 times daily    amLODIPine-atorvastatatin (CADUET) 5-10 MG per tablet Take 1 tablet by mouth daily    losartan-hydrochlorothiazide (HYZAAR) 50-12.5 MG per tablet Take 1 tablet by mouth every morning     Current Facility-Administered Medications   Medication Dose Route Frequency    sodium chloride 0.9 % FLUSH REQUIRED IF PATIENT HAS IV  0-500 mL/hr Intravenous PRN    dextrose 5 % FLUSH REQUIRED IF PATIENT HAS IV  0-500 mL/hr Intravenous PRN    sodium chloride 0.9 % IV  50 mL/hr Intravenous Continuous     No current outpatient medications on file.     Vitals:    11/20/19 0843   BP: (!) 172/99   Pulse: 83   Resp: 16   Temp: 36.5 C (97.7 F)   Weight: 111.3 kg (245 lb 4.8 oz)   Height: 175.3 cm (5' 9" )           Physical Examination:Oropharynx: mucosa pink and moist  Lungs: percussion normal, good diaphragmatic  excursion, lungs clear to auscultation bilaterally  Heart: regular rate and rhythm, no murmurs, gallops or rubs  Abdomen: abdomen soft, non-tender, nondistended, normal active bowel sounds, no masses or organomegaly  Neuro: No focal neurology  Extremities/Musculoskeletal: No rash,cyanosis,or edema noted       Last Lab Results:   Lab Results   Component Value Date/Time    WBC 4.5 04/07/2019 12:00 AM    HCT 38 (L) 04/07/2019 12:00 AM    PLT 262 04/07/2019 12:00 AM    INR 1.0 04/07/2019 12:00 AM    AST 60 (H) 04/07/2019 12:00 AM    ALT 49 04/07/2019 12:00 AM    ALK 67 04/07/2019 12:00 AM    TB 0.2 04/07/2019 12:00 AM    NA 141 04/07/2019 12:00 AM    K 3.0 (L) 04/07/2019 12:00 AM    UN 17 04/07/2019 12:00 AM    CREAT 1.22 (H) 04/07/2019 12:00 AM  Radiology impressions (last 30 days):  No results found.    Currently Active Problems:  There is no problem list on file for this patient.       Impression:  Rectal bleeding    Plan:  Colonoscopy  Moderate Sedation    UPDATES TO PATIENT'S CONDITION on the DAY OF SURGERY/PROCEDURE    I. Updates to Patient's Condition (to be completed by a provider privileged to complete a H&P, following reassessment of the patient by the provider):    Full H&P done today; no updates needed.    II. Procedure Readiness   I have reviewed the patient's H&P and updated condition. By completing and signing this form, I attest that this patient is ready for surgery/procedure.      III. Attestation   I have reviewed the updated information regarding the patient's condition and it is appropriate to proceed with the planned surgery/procedure.    Kelly Splinter, MD as of 9:08 AM 11/20/2019

## 2019-11-20 NOTE — Procedures (Signed)
Colonoscopy Procedure Note     Date of Procedure: 11/20/2019   Primary Physician: Arna Medici, MD        Attending Physician: Kelly Splinter, M.D.      Indications: Rectal bleeding  Previous colonoscopy: No  Medications:Fentanyl 100 mcg IV and Midazolam 5 mg IV were administered incrementally over the course of the procedure to achieve an adequate level of conscious sedation.     Informed Consent: Prior to the procedure, the patient was explained the risk, benefits and alternatives including the risk of bleeding, infection or perforation and informed consent was obtained.     Procedure Details: The patient was placed in the left lateral decubitus position and monitored continuously with ECG tracing, pulse oximetry, blood pressure monitoring and direct observations. After anorectal examination was performed, the Olympus CF-H180was inserted into the rectum and advanced under direct vision to the cecum, which was identified by  the ileocecal valve and the appendiceal orifice. The procedure was considered not difficult..    Narrative:    During withdrawal examination, the final quality of the prep was [facility] Bowel Prep Scale   Prep:    Right Colon: Grade3- (entire mucosa of colon segment seen well, with no residual staining, small fragments of stool, or opaque liquid)    Transverse Colon: Grade 2- (minor amount of residual staining, small fragments of stool, and/or opaque liquid, but mucosa of colon segment is seen well)    Left Colon: Grade 2- (minor amount of residual staining, small fragments of stool, and/or opaque liquid, but mucosa of colon segment is seen well)    A careful inspection was made as the colonoscope was withdrawn, a retroflexed view of the rectum was included; findings and interventions are described below. Appropriate photo documentation wasobtained. The patient  recovered in the [facility].    Findings:     Terminal Ileum: Normal. No polyps, ulcers or inflammation  identified.      Colon: 1. Ascending and rectal, 4 mm, polyp. Cold biopsy avulsed and sent to pathology.   2. Medium sized internal hemorrhoids.     Estimated Blood Loss:  There was no significant blood loss during the procedure.  Complications: none    Impression: 1. Polyp  2. Hemorrhoids    Recommendations: 1. Await pathology  2. If hyperplastic polyp, repeat colonoscopy in 10 years.  3. If adenomatous polyp, repeat colonoscopy in 5 years.  4.  Increase fiber in diet- Goal is 7 servings daily of fruits, vegetables and salads. Avoid processed fiber products e.g. Cereals or fiber bars and try to eat whole grains.   5. Benefiber 1 TBSP Daily    Kelly Splinter, MD  Cell: [phone]  Office Phone # [phone]

## 2019-11-20 NOTE — Discharge Instructions (Addendum)
[facility]  Discharge Instructions  For Colonoscopy    11/20/2019    9:08 AM      Findings:Polyps removed    Do not drive, operate heavy machinery, drink alcoholic beverages, make important personal or business decisions, or sign legal documents until next day.     Return to your usual diet.    Things you May Expect:   A small amount of bright red blood in your stool.   It may be a few days before you have a bowel movement.   You may have cramping, bloating, and feeling of "gas". These feelings should go away as you pass gas. If you still feel uncomfortable, walking around will help to pass the gas.   You were given medication to help you relax during the test. You may feel "fuzzy" and drowsy. Go home and rest for at least 4-6 hours.    You Should Call You Doctor For any of the Following:     Bad stomach pain    Fever    Bright red bleeding or clots (This may happen up to 2 weeks after the test.)    Dizziness or weakness that gets worse or last up to 24 hours.    Pain or redness at the IV site  If you have a serious problem after hours, Call [phone] to reach the GI physician on call. If you are unable to reach your doctor, go to the [facility].    Follow Up Care:  If biopsies were taken during your procedure, we will send you the pathology results within 14 days. If you do not receive your pathology results after 14 days please call [phone].    Results will be sent to your primary care doctor.    Repeat colonoscopy in 5 years.     Increase fiber in diet- Goal is 7 servings daily of fruits, vegetables and salads. Avoid processed fiber products e.g. Cereals or fiber bars and try to eat whole grains.     Benefiber 1 TBSP Daily        Provider Signature________________________________________________________

## 2019-11-21 ENCOUNTER — Encounter: Payer: Self-pay | Admitting: Gastroenterology

## 2019-11-22 LAB — SURGICAL PATHOLOGY

## 2019-12-20 ENCOUNTER — Encounter: Payer: Self-pay | Admitting: Gastroenterology

## 2020-01-28 ENCOUNTER — Other Ambulatory Visit: Payer: Self-pay | Admitting: Family Medicine

## 2020-01-28 ENCOUNTER — Ambulatory Visit
Admission: RE | Admit: 2020-01-28 | Discharge: 2020-01-28 | Disposition: A | Discharge status: Home / Self Care | Payer: Medicaid (Managed Care) | Source: Ambulatory Visit | Admitting: Radiology

## 2020-01-28 DIAGNOSIS — M7502 Adhesive capsulitis of left shoulder: Secondary | ICD-10-CM

## 2020-01-29 ENCOUNTER — Encounter: Payer: Self-pay | Admitting: Gastroenterology

## 2020-02-01 ENCOUNTER — Other Ambulatory Visit: Payer: Self-pay | Admitting: Urology

## 2020-02-01 DIAGNOSIS — R3989 Other symptoms and signs involving the genitourinary system: Secondary | ICD-10-CM

## 2020-02-04 ENCOUNTER — Ambulatory Visit: Payer: Medicaid (Managed Care) | Admitting: Urology

## 2020-02-04 ENCOUNTER — Other Ambulatory Visit: Payer: Self-pay | Admitting: Family Medicine

## 2020-02-04 ENCOUNTER — Encounter: Payer: Self-pay | Admitting: Urology

## 2020-02-04 ENCOUNTER — Ambulatory Visit
Admission: RE | Admit: 2020-02-04 | Discharge: 2020-02-04 | Disposition: A | Discharge status: Home / Self Care | Payer: PRIVATE HEALTH INSURANCE | Source: Ambulatory Visit | Attending: Family Medicine | Admitting: Family Medicine

## 2020-02-04 DIAGNOSIS — M19011 Primary osteoarthritis, right shoulder: Secondary | ICD-10-CM | POA: Insufficient documentation

## 2020-02-04 DIAGNOSIS — M7501 Adhesive capsulitis of right shoulder: Secondary | ICD-10-CM | POA: Insufficient documentation

## 2020-02-04 DIAGNOSIS — R3989 Other symptoms and signs involving the genitourinary system: Secondary | ICD-10-CM

## 2020-02-04 LAB — POCT URINALYSIS DIPSTICK
Blood,UA POCT: NEGATIVE
Glucose,UA POCT: NORMAL mg/dL
Ketones,UA POCT: NEGATIVE mg/dL
Leuk Esterase,UA POCT: NEGATIVE
Lot #: 43799603
Nitrite,UA POCT: NEGATIVE
PH,UA POCT: 6 (ref 5–8)
Protein,UA POCT: NEGATIVE mg/dL
Specific gravity,UA POCT: 1.015 (ref 1.002–1.030)

## 2020-02-04 MED ORDER — TADALAFIL 10 MG PO TABS *I*
10.0000 mg | ORAL_TABLET | Freq: Every day | ORAL | 4 refills | Status: DC | PRN
Start: 2020-02-04 — End: 2020-04-21

## 2020-02-04 NOTE — Progress Notes (Signed)
Dear Darren Medici, MD    Thank you for referring Darren Owens to me for consultation regarding ED. I saw him in our [place] office on 02/04/2020.  The following is my evaluation.    Chief complaint: ED    History of present illness:  Thank you for referring Darren Owens.  He is a pleasant 49 y.o. male with 2 years of erectile difficulty.    Has been using 100mg  Viagra for 2 years. Was working at first, less predicate now. Was not aware of the need to take on an empty stomach     No pain or curvature  Good libido.     Medications:   Current Outpatient Medications   Medication    sildenafil (VIAGRA) 100 MG tablet    aspirin (ASPIRIN EC) 81 MG EC tablet    cloNIDine (CATAPRES) 0.1 MG tablet    amLODIPine-atorvastatatin (CADUET) 5-10 MG per tablet    losartan-hydrochlorothiazide (HYZAAR) 50-12.5 MG per tablet     No current facility-administered medications for this visit.     Allergies:   Allergies   Allergen Reactions    Shellfish-Derived Products Anaphylaxis     PMH/PSH/SH/FH:  Past Medical History:   Diagnosis Date    Hypertension     Past Surgical History:   Procedure Laterality Date    CERVICAL SPINE SURGERY      PR COLONOSCOPY FLX DX W/COLLJ SPEC WHEN PFRMD N/A 11/20/2019    Procedure: COLONOSCOPY;  Surgeon: Birder Robson I, MD;  Location: [place];  Service: GI      Family History   Problem Relation Age of Onset    Cancer Maternal Grandmother     Social History     Tobacco Use    Smoking status: Never Smoker    Smokeless tobacco: Never Used   Substance Use Topics    Alcohol use: Yes     Comment: occasionally    Drug use: Never        Exam  Height 1.778 m (5\' 10" ), weight 111.1 kg (245 lb).  Physical Exam  GENERAL:  No acute distress, well developed  LUNG: Normal respiratory effort, no audible wheeze  PSYCHIATRIC:  Normal mood and affect    Labs  Urine analysis shows :   Recent Results (from the past 24 hour(s))   POCT urinalysis dipstick    Collection Time: 02/04/20   8:45 AM   Result Value Ref Range    Specific gravity,UA POCT 1.015 1.002 - 1.030    PH,UA POCT 6.0 5 - 8    Leuk Esterase,UA POCT Negative Negative    Nitrite,UA POCT Negative Negative    Protein,UA POCT Negative Negative mg/dL    Glucose,UA POCT Normal Normal mg/dL    Ketones,UA POCT Negative Negative mg/dL    Urobilinogen,UA      Bilirubin,Ur      Blood,UA POCT Negative Negative    Exp date 03/2020     Lot # [id number]      Assessment:  49 y.o. man with ED. Discussed that the etiology of ED is typically multifactorial and almost always has a component of age and mental stress.  Population studies have found that half of men over age 70 have difficulty with erections.      Will try Cialis next. If medication did not help the next step would be a vacuum erection device or penile injections.     Plan:  Return in about 4 months (around 06/05/2020) for ED.  Thank you for allowing me to participate in your patient's care. I will keep you informed regarding his future progress.    Sincerely,    Randall Hiss, MD  9:14 AM

## 2020-02-04 NOTE — Patient Instructions (Signed)
Cialis  Take 1 pill (10mg) as need to help with erections.  You must wait 3 days between doses since it stays in your system longer.    If 1 pill does not help, try 2 pills (20mg).  This is the maximum dose.    Call if the prescription is more than $50, there are generic versions some pharmacies carry.

## 2020-02-04 NOTE — Result Encounter Note (Signed)
[facility] patient, will address in their system

## 2020-02-06 ENCOUNTER — Ambulatory Visit: Payer: Medicaid (Managed Care) | Admitting: Physical Therapy

## 2020-02-08 ENCOUNTER — Ambulatory Visit: Payer: Medicaid (Managed Care) | Admitting: Physical Therapy

## 2020-03-03 ENCOUNTER — Ambulatory Visit: Payer: Medicaid (Managed Care) | Admitting: Urology

## 2020-04-21 ENCOUNTER — Other Ambulatory Visit: Payer: Self-pay | Admitting: Urology

## 2020-06-24 ENCOUNTER — Other Ambulatory Visit: Payer: Self-pay | Admitting: Urology

## 2020-06-24 DIAGNOSIS — R3989 Other symptoms and signs involving the genitourinary system: Secondary | ICD-10-CM

## 2020-06-25 ENCOUNTER — Ambulatory Visit: Payer: PRIVATE HEALTH INSURANCE | Admitting: Urology

## 2020-06-26 ENCOUNTER — Encounter: Payer: Self-pay | Admitting: Urology

## 2021-06-17 ENCOUNTER — Encounter: Payer: Self-pay | Admitting: Gastroenterology

## 2022-07-08 ENCOUNTER — Encounter: Payer: Self-pay | Admitting: Gastroenterology

## 2022-07-16 ENCOUNTER — Encounter: Payer: Self-pay | Admitting: Gastroenterology

## 2022-07-19 ENCOUNTER — Other Ambulatory Visit: Payer: Self-pay

## 2022-07-19 ENCOUNTER — Ambulatory Visit: Payer: No Typology Code available for payment source | Attending: Physician Assistant | Admitting: Physical Therapy

## 2022-07-19 DIAGNOSIS — M79651 Pain in right thigh: Secondary | ICD-10-CM | POA: Insufficient documentation

## 2022-07-19 NOTE — Progress Notes (Signed)
[facility]  Hip Initial Evaluation      Sent via: Fax     Physician attestation for Plan of Care: Physician: Lorine Bears, PA  Per signature, I have reviewed and agree with the documented plan of care.  Signature:________________________________________________                                                        Plan of Care    The physician's co-signature on this note indicates that they have reviewed this evaluation and agree with the documented goals and plan of care.       Name: Darren Owens  DOB: 31-Jan-1971  Referring Physician: Lorine Bears, PA  Today's Date: 07/19/2022  Visit #: 1    Diagnosis:     ICD-10-CM ICD-9-CM   1. Right thigh pain  M79.651 729.5       SUBJECTIVE:    Darren Owens is a 51 y.o. male who is present today for right thigh pain    Chief Complaint:  Patient reports in May 2023 he went out to run and when he was done he started to notice right groin and inner thigh pain.  He states the pain improved partially but he feels the inner thigh is very tight and has now started to be in the posterior thigh after he sits for about 15-20 minutes.   When he gets up the first few steps he feels very tight.      Onset date:  May 2023  Date of surgery: NA  Mechanism of injury/history of symptoms:  running injury   Prior therapy: NA  Diagnostic tests: None       Symptoms:  Symptom location: right inner and posterior thigh  Relevant symptoms:  Aching, Sharp, Pain , Decreased ROM  Symptom frequency: Constant    Pain/Symptom intensity:     On Initial Eval  DATE 07/19/22   Current 1/10   Best 1/10   Worse 9/10     Acceptable Pain Levels 0    Night Pain: Yes   Restful sleep:   Yes  Morning Pain/Stiffness:  Increased stiffness  Symptoms worsen with: sitting for more than 20 minutes,  Stairs, bending over to put shoes on ,  First few steps after sitting for long time   Symptoms improve with:  Wrapping the leg,  Heat, ice, medication    Prior/Current  Function  Prior level of function: Pt reports no limitations with performance of ADLs or functional activities prior to onset of current sxs/surgery.    Assistive device:  none    Pt. stated goals: to be able to sit for longer time     Functional Limitations  Activity Limitation Date 07/19/22 Patient Goal/comments   Stairs  Unable to do reciprocal consistently   do reciprocal pattern all the  Time     sitting  Unable to sit for more than 20 minutes   sit for over 20 minutes     working out    Moderate limitation  Wants to be able to work out fully without fear of injuring self more.      Outcome:    On Initial Eval  DATE 07/19/22   Hip Outcome Score 70 % ability     Social/Home  Living/Home Environment: Lives with family  and Multi level home  Additional Social History (including patient responsibilities): none     Occupation and Activities   Work status: Therapist, music title/type of work: Animator work  Chiropractor of job: Prolonged Sitting  Stresses/physical demands of home: trying to walk for exercise   Sport(s): NONE    Medications and Review of Health Factors    Current medications reviewed with Patient by evaluating therapist and potential effects on patient participation/outcome for therapy.   Instructed patient to refer to MD regarding medication questions  Relevant Comorbidities/Personal Factors:   High Blood Pressure.  Surgical history: n/a    These are relevant factors as they: may affect a Pt's ability to exercise and response exercise, thus impacting treatment outcome and plan of care.     Objective  Blood pressure: 140/86  Heart Rate: 81  SpO2: 96  Observation: in standing patient is unable to bend forward to touch his toes.  Patient is able to reach off to the right and go closer to floor than when he shifts to the left.     All trunk ROM WNL  Posture: Fair upright posture in sitting   Gait: WNL  Palpation:  tenderness with large hard area at the superior adductor musculature,  Pain upon  palpation to the right lateral thigh at greater trochanter and into the gluteus medius musculature  @ right              Hip RIGHT  LEFT     AROM PROM MMT AROM PROM MMT   Flexion 105* 115*  105 125    Abduction 30* 35*  40 45    IR Supine 30 30  35 35    ER Supine 12* 15*  35 40    Extension 5* 5*  5 10     * denotes pain     Slight resistance to right hip adductor generates significant pain  Hip MicroFET Measured in lbs Right Left   Hip Flexion 26.6 * 45.7    Caused pain on right   Hip Abduction 35.3 * 47.2   Hip IR 20.8 23.3   Hip ER 15* 28.3   Hip Extension 25.1 27.7   * denotes pain     LE Flexibility:  Quadricep:  Poor  Hamstring:  Poor  Adductors: Poor   Glut medius:  Poor          Lower Quarter Screen:  Lumbar Screen:  WNL  Neurologic:  NA         Treatment today consisted of:  Treatment: Details    Insurance details [employer]  POC/Visits 10 visits    45 authorized    THERAPEUTIC EXERCISE    Seated hip ER foot on stool prone quad stretch Hold 10 seconds repeat 3 reps    Supine hamstring stretch  Hold 10 seconds repeat 3 reps    Butterfly stretch Hold 10 seconds repeat 3 reps    Prone quad stretch  Instruction to keep hips level as he wanted to shift to the left.   Hold 10 seconds repeat 3 reps              MANUAL            NEUROMUSCULAR RE-ED            THERAPEUTIC ACTIVITY            GAIT        MODALITIES  OTHER    ROM        Assessment:  Patient presents today in PT with complaints of right adductor pain and lateral thigh pain that extends into the buttock.  Patient seems to have injured his right hip adductor in May 2023 after running and has developed extreme tenderness along with an enlarged firm area at the superior portion of musculature that causes a limiting factor for patient and may need to be further evaluated by an orthopedic MD.  Patient has also developed right greater trochanter and gluteal pain that is aggravated with prolonged sitting.  Patient has decreased flexibility throughout  the right hip musculature and quad that is effecting his functional abilities.  Strength is also compromised in the right hip musculature which also contributes to his loss of function.  Patient will benefit from PT to work on soft tissue mobilization, stretching and strengthening to try to decrease inflammation, improve flexibility and strength to allow patient to tolerate functional activities with more ease.       A history with 1-2 personal factors and/or comorbidities that impact the plan of care.  An examination of body system(s) using standardized test and measures addressing 3-4 elements from any of the following: body structures and functions, activity limitations and/or participation restrictions.  A clinical presentation with evolving/changing characteristics. A Moderate Complexity evaluation was performed today.     Prognosis:  Fair   Contraindications/Precautions/Limitation:  Per diagnosis    Goals Established on: 07/19/22 Length Status   In 4 visits patient will report less pain through the hip Short Term New   In 4 visits patient will report less functional limitation with mobility Short Term New   In 4 visits patient will report improved ability to put on socks and shoes Short Term New   In 4 visits patient will be independent with home stretching and will be working toward strengthening Short Term New   At discharge in 10 visits patient will be fully independent with strengthening activities  Long Term, discharge New   At discharge in 10 visits patient report that patient stated goals are at least 80% achieved and will feel comfortable working independent of therapy Long Term, discharge New    Long Term, discharge  New   At discharge in 10 visits patient will demonstrate > 9-point improvement in the Hip Outcome Score (HOS) in order to demonstrate a clinically significant improvement in functional ability and ADL performance Long Term, discharge New        Plan of Care Treatment Plan: Established on  07/19/22  Anticipated plan of care reviewed with patient and/or family:  Yes  Freq 1-2 times per week for 10 visit(s) and will complete progress note to MD    Treatment plan inclusive of:   Exercise: AROM, Stretching, Strengthening, Progressive Resistive   Manual Techniques:  Joint mobilization, Soft tissue release/massage   Modalities: Neuromuscular Re-ed,  , Ultrasound   Functional: Functional rehab, Postural training, Gait training, Home management  CPTs: Therapeutic Exercise (97110), Therapeutic Activity (97530), Neuromuscular Red-ed (16109), Manual Therapy (97140), Gait Training (60454), Ultrasound (09811) and Hot/cold Packs (97010)       Thank you for referring this patient to [facility].    Amanda Pea, PT       Time Reporting    Service-Based Procedures/ Modalities    Evaluation (high, moderate, low) 45   Re-evaluation    Traction, mechanical    Electric stimulation (unattended)    Total  service-based billable procedures 45       Time-Based  Procedures / Modalities    Therapeutic ex 10   Neuromuscular Re-ed    Manual Therapy    Therapeutic Activities    Gait training, including stairs    Ultrasound    Electrical Stimulation    Iontophoresis    Physical Performance Test    Aquatic Therapy    Total Time-Based Billable Procedures 10       Total Treatment Time 55

## 2022-07-26 ENCOUNTER — Encounter: Payer: Self-pay | Admitting: Gastroenterology

## 2022-07-26 ENCOUNTER — Other Ambulatory Visit: Payer: Self-pay

## 2022-07-26 ENCOUNTER — Ambulatory Visit: Payer: No Typology Code available for payment source | Admitting: Physical Therapy

## 2022-07-26 DIAGNOSIS — M79651 Pain in right thigh: Secondary | ICD-10-CM

## 2022-07-26 NOTE — Progress Notes (Signed)
[facility]  Daily Note    Name: Darren Owens  DOB: 09-05-1971  Referring Physician: Lorine Bears, PA  Today?s Date: 07/26/2022  Visit #: 2    Diagnosis:     ICD-10-CM ICD-9-CM   1. Right thigh pain  M79.651 729.5          Subjective:  Patient reports the stretching is helping him get his socks and shoes on better.  Patient reports he was a little sore from the stretching as he would at time overdue things.       Objective:     Treatment today consisted of:  Treatment: Details   ? Insurance details [employer]  POC/Visits 10 visits    45 authorized    THERAPEUTIC EXERCISE ?   Seated hip ER foot on stool prone quad stretch Hold 10 seconds repeat 3 reps   Doing at home.    Supine ER stretch   Left knee extended   Right foot just below the knee   Hold 10 seconds repeat 3 reps     Supine hamstring stretch  Hold 10 seconds repeat 3 reps    Supine Butterfly stretch Hold 10 seconds repeat 3 reps    Prone quad stretch using strap  Instruction to keep hips level as he wanted to shift to the left.   Hold 10 seconds repeat 3 reps      Left sidelying hip abduction? ?   10 reps hold 2 seconds    Clams right LE ? ?10 reps hold 2 seconds   SLR flexion 10 reps hold 2 seconds      Bridging   10 reps hold 5 seconds    MANUAL IASTM using concave tool to the right distal adductor and medial hamstring  10 minutes    Patient had improved mobility and decreased pain after this was done.     ? ?   ? ?   NEUROMUSCULAR RE-ED ?   ? ?   ? ?   THERAPEUTIC ACTIVITY ?   ? ?   ? ?   GAIT ?   ? ?   MODALITIES ?   ? ?   ? ?   OTHER ?   ROM ?       Assessment:  Patient had improved mobility with ER today which has helped him to be able to get his socks and shoes on with more ease at home.  Added a strap to the quad stretch today to allow more ROM during the stretch with less stress on the hamstring.   Started strenthening and he tolerated well but fatigued quickly.   IASTM was started also with  good success per report of patient when finished.  Patient continues to benefit from PT to improve flexibiltiy, strength in the right LE to allow him to have less pain during functional activities.          Plan of Care: Assess benefit of the IASTM,  add more strengthening exercises and attemp 1/2 kneel hip flexor stretch.       Thank you for referring this patient to [facility].    Amanda Pea, PT       Time Reporting Minutes   Service-Based Procedures/ Modalities    Evaluation (high, moderate, low)    Re-evaluation    Traction, mechanical    Electric stimulation (unattended)    Total service-based billable procedures  Time-Based  Procedures / Modalities    Therapeutic ex 25   Neuromuscular Re-ed    Manual Therapy 10   Therapeutic Activities    Gait training, including stairs    Ultrasound    Electrical Stimulation    Iontophoresis    Physical Performance Test    Aquatic Therapy    Total Time-Based Billable Procedures 35       Total Skilled Treatment Time 35

## 2022-08-03 ENCOUNTER — Ambulatory Visit: Payer: No Typology Code available for payment source | Admitting: Physical Therapy

## 2022-08-10 ENCOUNTER — Ambulatory Visit: Payer: No Typology Code available for payment source | Admitting: Physical Therapy

## 2022-08-10 ENCOUNTER — Telehealth: Payer: Self-pay | Admitting: Physical Therapy

## 2022-08-10 NOTE — Telephone Encounter (Signed)
Called patient due to missed appointment.  He reports he forgot as a reminder did not show up on his phone.   He was reminded of appointment at 7 am on 08/18/22/

## 2022-08-18 ENCOUNTER — Ambulatory Visit: Payer: No Typology Code available for payment source | Admitting: Physical Therapy

## 2022-08-31 ENCOUNTER — Encounter: Payer: Self-pay | Admitting: Physical Therapy

## 2022-08-31 NOTE — Progress Notes (Signed)
[facility]  Discharge From Care     Name: Darren Owens  DOB: 01/16/1971  Referring Physician: Lorine Bears, PA   Today's Date: 08/31/2022    Diagnosis: Diagnosis:       ICD-10-CM ICD-9-CM   1. Right thigh pain  M79.651 729.5         Subjective: At the last visit patient felt the stretching was really helping him be able to move better.       Darren Owens is a 51 y.o. male being discharged for Non-compliance to attendance guidelines .     Pt has received care for 2 visits to date.    Reporting period of treatment: 07/19/22-08/31/22     Attendance: Poor      Compliance:  unknown       Functional abilities assessed on prior visits or phone conversation: unable to assess    Comment: Patient no showed for his last 2 appointments even with phone call being made to remind him of the appointments.      Treatment Plan:  Darren Owens is currently being discharged from rehabilitation services at this time.  If you feel further services are appropriate, please forward an updated referral.  Also, please feel free to contact me with any questions or concerns regarding this patient.        Thank you for referring this patient to [facility].     Amanda Pea, PT

## 2022-09-06 ENCOUNTER — Encounter: Payer: Self-pay | Admitting: Gastroenterology

## 2022-10-14 ENCOUNTER — Ambulatory Visit: Payer: No Typology Code available for payment source | Admitting: Orthopedic Surgery

## 2022-10-14 ENCOUNTER — Ambulatory Visit
Admission: RE | Admit: 2022-10-14 | Discharge: 2022-10-14 | Disposition: A | Discharge status: Home / Self Care | Payer: No Typology Code available for payment source | Source: Ambulatory Visit

## 2022-10-14 ENCOUNTER — Other Ambulatory Visit: Payer: Self-pay

## 2022-10-14 DIAGNOSIS — M1611 Unilateral primary osteoarthritis, right hip: Secondary | ICD-10-CM | POA: Insufficient documentation

## 2022-10-14 DIAGNOSIS — M25551 Pain in right hip: Secondary | ICD-10-CM

## 2022-10-24 ENCOUNTER — Other Ambulatory Visit: Payer: Self-pay

## 2022-10-24 ENCOUNTER — Encounter: Payer: Self-pay | Admitting: Urgent Care

## 2022-10-24 ENCOUNTER — Ambulatory Visit: Payer: No Typology Code available for payment source | Attending: Urgent Care | Admitting: Urgent Care

## 2022-10-24 VITALS — HR 98 | Temp 98.1°F | Resp 20

## 2022-10-24 DIAGNOSIS — Z1159 Encounter for screening for other viral diseases: Secondary | ICD-10-CM | POA: Insufficient documentation

## 2022-10-24 DIAGNOSIS — R509 Fever, unspecified: Secondary | ICD-10-CM

## 2022-10-24 DIAGNOSIS — U071 COVID-19: Secondary | ICD-10-CM

## 2022-10-24 DIAGNOSIS — J069 Acute upper respiratory infection, unspecified: Secondary | ICD-10-CM

## 2022-10-24 NOTE — UC Provider Note (Signed)
Chief Complaint:   Chief Complaint   Patient presents with    Fever     Pt has had a runny nose x 3 days, developed fever yesterday. Pt has taken tylenol and ibuprofen.         HPI:  51 y.o. male w hx as noted in chart presents to Urgent Care with symptoms concerning for COVID 19. Patient complains of 3rd day of the following symptoms: clear runny nose.   Fatigue.  Fever  onset yesterday.  Patient states he just wants to sleep.  Concerned about return to work Tuesday and exposing others to illness.  Denies any cough, shortness of breath.  No hx of dm or chronic pulm disease.  Present with significant other.  Denies any known illness exposures.  Has not done home testing.  Generally healthy.  Has not called pcp and is here.   Denies any gi illness symptoms.  Is able to eat and drink but not terribly hungry.            Symptoms have progressively worsened     COVID exposure / other sick contacts: no  COVID 19 positive in the last 3 months: no  Fully vaccinated for COVID 19: no      VITALS:  Pulse 98   Temp 36.7 C (98.1 F) (Temporal)   Resp 20   SpO2 98%      ROS: See HPI above     PHYSICAL EXAM: I was gloved, face shielded and fit test N95 respirator worn for entire duration of being in patient exam room  Appearance: no acute distress. Does appear fatigued.     Eyes: No conjunctival injection or drainage  Ears: Normal TMs and canals bilaterally  Nose: clear rhinorrhea   Throat: normal  Neck: no cervical LAD or tenderness, soft  CV: RRR, no murmurs   Pulm: Lungs Clear to auscultation bilaterally, no acute respiratory distress     MDM:  Assessment: 51 y.o. male w hx as noted in chart presents to UC with acute viral illness.  Fatigue, reports hx of fever and rhinorrhea.   Rsv, influenza and . COVID 19 PCR testing ordered (see below if other labs ordered). The patient was informed the COVID 19 result will be available in MyChart and the need for isolation per CDC guidelines. Recommend supportive care at this time.  Advised close follow up w PCP as needed. Appropriate Handouts provided. Comfort care measures reviewed.   He will be notified of test results, if abnormal.  Advised to contact pcp if covid specifically is positive as he may be eligible for antiviral medications.   To ED for chest pain,shortness of breath or if intolerant of oral hydration.         Plan and Results:     Orders Placed This Encounter    COVID/Influenza A & B/RSV NAAT (PCR)       No results found for this or any previous visit (from the past 24 hour(s)).      Diagnosis and Disposition:  1. Viral URI    2. Febrile illness        Patient Instructions   Patient will be notified  with  covid 19 test results,if positive,  when available.  Continue with good oral hydration. Use Tylenol for fevers and body aches.  Follow package directions.  Continue your normal medications. Please read all accompanying information regarding isolation.  Even if your test is negative we recommend isolation for duration of illness especially  fever.  Please contact  personal physician and /or your employer regarding specific return to normal activity instructions. Public health Department may be involved if testing is positive.  Seek additional evaluation for persistent fevers, shortness of breath,chest pain, inability to tolerate oral hydration, extreme fatigue or change in your mentation. If you are unable to access  private medical care go to nearest emergency room and follow any instructions they have posted prior to entry.      Testing for influenza and rsv has also been obtained.  patient will be notified if test results are abnormal      Encourage fluids, rest, & good hand hygiene.    Use over the counter medications as discussed.  Given the patient's reassuring vital signs and overall well appearance, the patient can be managed safely at home. Hydration advised. Patient advised to go to ED for any worsening or concerning symptoms.

## 2022-10-24 NOTE — Patient Instructions (Signed)
Patient will be notified  with  covid 19 test results,if positive,  when available.  Continue with good oral hydration. Use Tylenol for fevers and body aches.  Follow package directions.  Continue your normal medications. Please read all accompanying information regarding isolation.  Even if your test is negative we recommend isolation for duration of illness especially fever.  Please contact  personal physician and /or your employer regarding specific return to normal activity instructions. Public health Department may be involved if testing is positive.  Seek additional evaluation for persistent fevers, shortness of breath,chest pain, inability to tolerate oral hydration, extreme fatigue or change in your mentation. If you are unable to access  private medical care go to nearest emergency room and follow any instructions they have posted prior to entry.      Testing for influenza and rsv has also been obtained.  patient will be notified if test results are abnormal

## 2022-10-25 LAB — COVID/INFLUENZA A & B/RSV NAAT (PCR)
COVID-19 NAAT (PCR): POSITIVE — AB
Influenza A NAAT (PCR): NEGATIVE
Influenza B NAAT (PCR): NEGATIVE
RSV NAAT (PCR): NEGATIVE

## 2022-10-27 ENCOUNTER — Telehealth: Payer: Self-pay

## 2022-10-27 NOTE — Telephone Encounter (Signed)
Returning call to pt from yesterday. Left message to call UC with questions regarding return to work.

## 2022-10-28 ENCOUNTER — Ambulatory Visit: Payer: No Typology Code available for payment source | Admitting: Orthopedic Surgery

## 2022-11-11 ENCOUNTER — Ambulatory Visit
Admission: RE | Admit: 2022-11-11 | Discharge: 2022-11-11 | Disposition: A | Discharge status: Home / Self Care | Payer: No Typology Code available for payment source | Source: Ambulatory Visit

## 2022-11-11 ENCOUNTER — Ambulatory Visit: Payer: No Typology Code available for payment source | Admitting: Orthopedic Surgery

## 2022-11-11 ENCOUNTER — Encounter: Payer: Self-pay | Admitting: Orthopedic Surgery

## 2022-11-11 ENCOUNTER — Other Ambulatory Visit: Payer: Self-pay

## 2022-11-11 VITALS — BP 124/88 | HR 87 | Ht 69.5 in | Wt 267.9 lb

## 2022-11-11 DIAGNOSIS — M25552 Pain in left hip: Secondary | ICD-10-CM

## 2022-11-11 DIAGNOSIS — M25551 Pain in right hip: Secondary | ICD-10-CM | POA: Insufficient documentation

## 2022-11-11 DIAGNOSIS — M1711 Unilateral primary osteoarthritis, right knee: Secondary | ICD-10-CM

## 2022-11-11 DIAGNOSIS — M1611 Unilateral primary osteoarthritis, right hip: Secondary | ICD-10-CM

## 2022-11-11 NOTE — Patient Instructions (Addendum)
Right hip pain:    We are going to get right and left femur films today and reach out to one of our partners.     Please call my office at [phone] (option 3) with any questions or problems.    =========================================================================  FOR MORE INFORMATION ON YOUR CONDITION FOLLOW THE FOLLOWING STEPS:    If Dr. Izola Price would like you to perform exercises (physical therapy) for hip and knee problems you can find video examples of these exercises here.      For general information on hip and knee arthritis follow this link - Dr. Oneal Deputy Guide to Hip & Knee Arthritis.  This link will also contain more information about surgery and how to best prepare for surgery.    A good source of information can by found here at the American Association of Hip & Knee Surgeons.    Robotic or computer navigated joint replacement has been in the news recently and has been around for some time with no proven benefit to date that it makes a difference.  In fact a recent study in our leading total joint journal showed that information available to patients is inaccurate and difficult to understand.  It is a hot topic and understandably of interest to patients so I would encourage you to ask me questions if you have regarding any of this this technology.     If you are getting a total KNEE replacement I feel very strongly that you need to follow the instructions at the link HERE for postoperative care.     If you are getting a total HIP replacement I feel very strongly that you need to follow the instructions at the link HERE for postoperative care.      If applicable implant selection discussed, any questions entertained and answered.    If for some reason the blue hyperlinks above do not work you can find this information on my faculty webpage by following the screen shot information below.       1.  Please "Google Search" the phrase "[facility] Arbutus Leas" and my [facility] web  page will be one of the first 2  links.  (see first image 1 below)     2.  Click on the link and halfway down that page under "additional links" click on "Dr.  Oneal Deputy Guide to Hip and Knee Arthritis"  (see second image 2 below)     3.  Click on the link "Dr. Oneal Deputy Owners Guide to Hip & Knee Problems  (short).boxnote" (see third image 3 below) OR for exercises look in               "EXERCISE" folder     Here you will find information you may find useful regarding hip and knee pain.     IMAGE 1:    IMAGE 2:        IMAGE 3:

## 2022-11-11 NOTE — Progress Notes (Signed)
ASSESSMENT/PLAN  ----------------------  Diagnoses:  1. Medial thigh mass with possible calcification in the right thigh area (possible heterotopic ossification or injury-related lesion)  2.  Fusiform shape of bone around the midshaft of his left femur we were supposed to get left femur films on his back to clinic today they took the right femur but did not get the left.  Not related to any known trauma to explain the significant amount of bone formation    Plan:  - Took X-ray images for further review on November 16th and December 14th.  - Awaiting additional consultation with a musculoskeletal oncologist for expert evaluation due to unusual bone formation observed in imaging.     Follow-up:  - Pending feedback from the musculoskeletal oncologist for further steps or treatment options.   - Advised patient to continue taking Ibuprofen or Aleve to help manage the pain.    SUBJECTIVE  --------------  Chief Complaint: Chronic right thigh pain, possible muscle strain    History of Present Illness:  Patient s a 51 year old male, who has been experiencing chronic pain in his right medial thigh area since May of the current year. He initially believed he pulled a muscle while jogging. He has approached his primary healthcare provider and had undergone physical therapy for about three months, which seemed to have benefitted him as he reported a decrease in pain levels and improvements in his mobility.  Patient reports chronic throbbing pain located in the medial thigh that radiates backward, intensifying either when stationary for longer durations or when pressure is applied to the affected area. He has also noted the development of a perceivable mass in the same location. Darren Owens occupation involves a significant extent of physical mobility, but it was not clear how it might relate to his pain, other than potential stress from standing for prolonged periods.    Past Medical History Discussed in Visit:  - No history of  Venous Thromboembolism  - Specialists: Physical Therapy.    Social History Discussed in Visit:  -Patient is currently employed    Allergies Discussed in Visit: None     Medications Discussed in Visit:  - Ibuprofen (as needed)  - Muscle relaxant (as needed)    OBJECTIVE  ------------  Physical Exam:  - Patient presents with a non-antalgic gait.  - On the left side, the patient demonstrated hip flexion up to 90 degrees with no pain during internal and external rotation.  - Five of five on the knee extension and dorsiflexion, plantar flexion strength, and intact light-touch sensation from L2 to S1, bilaterally  - On the right side, a similar set of motions as in the left hip were observed with some mild discomfort between internal and external rotation at 90 degrees.  - Notable mass found in the right medial thigh region (adductor region). The mass, approximately 6-8 centimeters in length and 3-4 centimeters in width, was hard to the touch and caused discomfort when palpated.    Imaging:  - X-rays dated November 16th and December 14th were personally reviewed,   - Notably, the right femur indicated an unusual bone formation, and a similar pattern was observed on the left without the presence of the medially located mass as it was on the right side.    Patient has given prior verbal consent to have the conversation recorded and summarized by the [employer].    Arbutus Leas, MD, MPT  Associate Professor  Division of Adult Reconstruction   Department of Orthopaedic Surgery  [place] [facility] [place]  Office phone:  [phone]  Appointments:  [phone]    Answers submitted by the patient for this visit:  Right leg (Submitted on 11/10/2022)  Chief Complaint: Right leg pain  Date of onset: : 04/12/2022  Was this the result of an injury?: No  What is your pain level?: 2/10  Please describe the quality of your pain: : aching, discomfort, dull ache  What diagnostic workup have you had for this condition?: ultrasound,  X-ray  What treatments have you tried for this condition?: heat, ice, NSAIDs, physical therapy  Is this a work related condition? : No  Current work status: : usual activities

## 2022-12-02 ENCOUNTER — Ambulatory Visit: Payer: No Typology Code available for payment source | Admitting: Orthopedic Surgery

## 2022-12-09 ENCOUNTER — Telehealth: Payer: Self-pay | Admitting: Orthopedic Surgery

## 2022-12-09 NOTE — Telephone Encounter (Signed)
Spoke with Raiford Noble, confirmed appointment time change on 1.18.24

## 2022-12-16 ENCOUNTER — Encounter: Payer: Self-pay | Admitting: Orthopedic Surgery

## 2022-12-16 ENCOUNTER — Ambulatory Visit: Payer: No Typology Code available for payment source | Admitting: Orthopedic Surgery

## 2022-12-16 ENCOUNTER — Other Ambulatory Visit: Payer: Self-pay

## 2022-12-16 ENCOUNTER — Ambulatory Visit
Admission: RE | Admit: 2022-12-16 | Discharge: 2022-12-16 | Disposition: A | Discharge status: Home / Self Care | Payer: No Typology Code available for payment source | Source: Ambulatory Visit

## 2022-12-16 VITALS — BP 132/91 | HR 84 | Ht 69.0 in | Wt 265.0 lb

## 2022-12-16 DIAGNOSIS — M898X9 Other specified disorders of bone, unspecified site: Secondary | ICD-10-CM

## 2022-12-16 DIAGNOSIS — M25552 Pain in left hip: Secondary | ICD-10-CM

## 2022-12-16 DIAGNOSIS — D480 Neoplasm of uncertain behavior of bone and articular cartilage: Secondary | ICD-10-CM

## 2022-12-16 DIAGNOSIS — M21952 Unspecified acquired deformity of left thigh: Secondary | ICD-10-CM

## 2022-12-16 DIAGNOSIS — M898X5 Other specified disorders of bone, thigh: Secondary | ICD-10-CM
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
## 2023-03-15 ENCOUNTER — Telehealth: Payer: Self-pay | Admitting: Orthopedic Surgery

## 2023-03-15 NOTE — Telephone Encounter (Signed)
Spoke with Darren Owens, confirmed rescheduled appointment

## 2023-03-18 ENCOUNTER — Ambulatory Visit: Payer: No Typology Code available for payment source | Admitting: Orthopedic Surgery

## 2023-03-21 ENCOUNTER — Ambulatory Visit
Admission: RE | Admit: 2023-03-21 | Discharge: 2023-03-21 | Disposition: A | Discharge status: Home / Self Care | Payer: No Typology Code available for payment source | Source: Ambulatory Visit

## 2023-03-21 ENCOUNTER — Other Ambulatory Visit: Payer: Self-pay

## 2023-03-21 ENCOUNTER — Encounter: Payer: Self-pay | Admitting: Orthopedic Surgery

## 2023-03-21 ENCOUNTER — Ambulatory Visit: Payer: No Typology Code available for payment source | Admitting: Orthopedic Surgery

## 2023-03-21 VITALS — BP 138/95 | Ht 69.0 in | Wt 265.0 lb

## 2023-03-21 DIAGNOSIS — M61551 Other ossification of muscle, right thigh: Secondary | ICD-10-CM

## 2023-03-21 DIAGNOSIS — M1611 Unilateral primary osteoarthritis, right hip: Secondary | ICD-10-CM

## 2023-03-21 DIAGNOSIS — M898X9 Other specified disorders of bone, unspecified site: Secondary | ICD-10-CM

## 2023-03-21 DIAGNOSIS — D480 Neoplasm of uncertain behavior of bone and articular cartilage: Secondary | ICD-10-CM

## 2023-03-21 DIAGNOSIS — M1711 Unilateral primary osteoarthritis, right knee: Secondary | ICD-10-CM

## 2023-03-21 DIAGNOSIS — M61552 Other ossification of muscle, left thigh: Secondary | ICD-10-CM

## 2023-03-21 DIAGNOSIS — M898X5 Other specified disorders of bone, thigh: Secondary | ICD-10-CM

## 2023-03-21 DIAGNOSIS — M84452D Pathological fracture, left femur, subsequent encounter for fracture with routine healing: Secondary | ICD-10-CM

## 2023-03-21 DIAGNOSIS — M5416 Radiculopathy, lumbar region: Secondary | ICD-10-CM

## 2023-03-21 MED ORDER — METHYLPREDNISOLONE 4 MG PO TBPK *A*
ORAL_TABLET | ORAL | 0 refills | Status: DC
Start: 2023-03-21 — End: 2024-08-01

## 2023-03-21 NOTE — Progress Notes (Signed)
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 He reports that over the past several weeks, he has developed new left posterior thigh pain radiating from buttock down posterior thigh and leg into plantar foot. Reports some numbness in this distribution as well. Endorses some low back pain that began around the same time. Started while on a 3 hour drive to [place]. Worse with standing or sitting for longer periods. Reports difficulty with push off on the left leg while ambulating. Tried a course a steroids which helped significantly. Has been stretching, specifically nerve gliding exercises he found online which has been helpful. Currently taking naproxen which helps. Overall symptoms are improving.     Physical Examination:  BP (!) 138/95   Ht 1.753 m (5\' 9" )   Wt 120.2 kg (265 lb)   BMI 39.13 kg/m   General:  Awake and alert in no distress.  Appears at stated age.  Psych:  Affect is appropriate.  Respirations:  Unlabored effort, No audible wheeze.  CV:  Regular rate and rhythm  MSK:  An examination of left lower extremity was performed. No discrete areas of tenderness. Negative SLR bilaterally. Some weakness and discomfort with knee extension. 5/5 strength with EHL/FHL, ADF/APF. Numbness/paresthesias in the S1 distribution, otherwise sensation intact. 2+ DP pulse, toes WWP.    Imaging:  Left and right femur XR reviewed. Demonstrates stable appearance of bilateral thigh heterotopic ossification and left proximal femur cortical bone formation    Impression/Plan:  Follow up bilateral thigh heterotopic ossification and left proximal femur cortical bone formation likely secondary to remote trauma. His new left leg pain and paresthesias is most suggestive of a spine etiology. Peripheral nerve compression related to the heterotopic ossification is a consideration, though seems less likely given  the stable appearance of bone formation on XRs today and the radicular distribution of symptoms. We will order him one additional Medrol dose pack. He was advised not to take naproxen while on this medication. He was referred to physical therapy. If his symptoms do not improve, we would consider a lumbar spine MRI to evaluate further. We will see him back in 6 months with repeat b/l femur XRs for routine monitoring of the left proximal femur and bilateral heterotopic bone formation.    Gerald Leitz, MD  Orthopedic Surgery Resident  03/21/2023 at 3:45 PM    I saw and evaluated the patient. I have reviewed and edited the resident's/fellow's note and confirm the findings and plan of care as documented.    Christa See, MD

## 2023-03-24 ENCOUNTER — Ambulatory Visit: Payer: No Typology Code available for payment source | Admitting: Orthopedic Surgery

## 2023-03-29 ENCOUNTER — Other Ambulatory Visit: Payer: Self-pay

## 2023-03-29 ENCOUNTER — Ambulatory Visit: Payer: No Typology Code available for payment source | Attending: Orthopedic Surgery | Admitting: Physical Therapy

## 2023-03-29 DIAGNOSIS — M5416 Radiculopathy, lumbar region: Secondary | ICD-10-CM | POA: Insufficient documentation

## 2023-03-29 DIAGNOSIS — M898X9 Other specified disorders of bone, unspecified site: Secondary | ICD-10-CM | POA: Insufficient documentation

## 2023-03-29 NOTE — Progress Notes (Signed)
[facility]  Part of [facility]  Low Back Initial Evaluation      Sent via: eRecord EMR     Physician attestation for Plan of Care: Physician: Carin Hock*  Per signature, I have reviewed and agree with the documented plan of care.  Signature:________________________________________________                                                        Plan of Care   The physician's co-signature on this note indicates that they have reviewed this evaluation and agree with the documented goals and plan of care.       Name: Darren Owens  DOB: [birth year]  Referring Physician: Carin Hock*  Today's Date: 03/29/2023  Visit #: 1      Diagnosis:     ICD-10-CM ICD-9-CM   1. Heterotopic ossification of bone  M89.8X9 733.99   2. Left lumbar radiculopathy  M54.16 724.4       SUBJECTIVE:    Darren Owens is a 52 y.o. male who is present today for Lumbar      Chief Complaint - Patient reports improvement in symptoms since starting pressups and nerve glide exercises, but continues with left LE symptoms that include numbness in left posterior thigh, sensation changes in left lateral foot, and onset of weakness with walking in the left foot in which he can't push off on the foot. Sitting results in back pain and with standing there is shooting nerve pain in the left leg.     Onset date:  8 weeks  Date of surgery: NA  Mechanism of injury/history of symptoms:  No specific cause and started about 2 weeks after traveling with prolonged sitting.  Also last year had right thigh and groin pain with the ossification in the femur and compensated with more weightbearing on left.   Prior therapy: NA  Diagnostic tests: X-ray Results Show ossifications through bilateral femurs    Symptoms:  Symptom location: Left lumbar, left buttocks and posterior thigh, left lateral calf to the foot  Relevant symptoms:  Nerve pain and numbness in foot. Onset of weakness in  plantarflexion with walking  Symptom frequency: Constant    Pain intensity:   Date 03/29/23   Current Left Lumbar pain - 5/10  Left Posterior Thigh - 3/10  Left Lower leg and lateral Pain - 5/10   Best Left Lumbar pain - 0/10  Left Posterior Thigh - 2/10  Left Lower leg and lateral Pain - 5/10   Worse Left Lumbar pain - 7/10 Sitting  Left Posterior Thigh - 7/10 Sitting  Left Lower leg and lateral Pain 9/10 walking and foot gets weak.      Acceptable Pain Level 0    Night Pain: Yes   Restful sleep:   No Tries to sleep sidelying with pillow between legs.   Morning Pain/Stiffness:  First 30 minutes feels better and then immediately starts with sitting and moving.   Symptoms worsen with: Sitting for back, standing and waking for left leg  Symptoms improve with: Medication helps a little.     Prior/Current Function  Prior level of function: Pt reports no limitations with performance of ADLs or functional activities prior to onset of current sxs/surgery.    Assistive device:  none  Pt. stated goals: As reported on 03/29/23 Reduce the pain    Functional Limitations  Activity Limitation   Patient Goal/Comments   Sitting Moderate Limitation    Walking/standing Mild limitation - progressively worsens.            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 Occasional walking to look at a property may be uneven ground or in a construction site.   Stresses/physical demands of job:  Mostly sitting at a computer. Has a sit and stand desk   Stresses/physical demands of home: Self Care and Housekeeping  Hobbies: Up until January 2024 he was exercising, but with the pain he has  stopped all exercise.     Medications and Review of Health Factors    Current medications reviewed with Patient by evaluating therapist and potential effects on patient participation/outcome for therapy.   Instructed patient to refer to MD regarding medication questions    Relevant Comorbidities/Personal Factors: Heterotrophic ossifications bilateral thighs. Weight gain and high blood pressure.     Surgical history: May 2017 had cervical spinal fusion for a bony growth in the neck and causing left arm numbness.     These are relevant factors as they: may affect a Pt's motivation and response to therapeutic interventions, may affect a Pt's ability to exercise and response exercise, may affect tissue healing response, and may affect muscle function, thus impacting treatment outcome and plan of care.      Objective  Blood pressure: 120/85  Heart Rate: 85 bpm  SpO2: 99%     Posture:  Mild loss of lumbar lordosis  Palpation:  unremarkable @ spine  Incision:  NA    Lumbar Spine AROM (Percent Loss)    DATE: 03/29/23   Flexion No loss   Extension No loss   Right Sidebending No loss   Left Sidebending No loss     Lumbar Special Testing: Straight Leg test: Right negative, Left positive    Lumbar Joint Mobility:    Joint Force Direction Grade End Feel Symptoms   L3 P/A Central III Normal Status quo   L4 P/A Central III Normal Improves   L5 P/A Central III Normal Improves     Repeated Motions: Symptoms to Start Left thigh and back  Motion Number of reps/time Symptom Response Sign Response Comments   Prone through press ups and press ups with overpressure 2 minutes prone and pressups X 10 and with overpressure X 10 Better less pain Unchanged.                     Neurological: Not assessed on evaluation, but there is the report of weakness in left plantarflexion with walking.    Assessment:  Patient presents with complaint of what appears to be left lumbar radiculopathy, however there is the concern of the heterotrophic  ossifications in the left thigh (actually bilateral thighs) and history of left cervical fusion due to a bony growth.  On exam he has no loss of lumbar ROM and motion toward extension is positive with relief of symptoms. There is a positive SLR. He is encouraged to work on  the press ups, but also with overpressure, sit with good posture, work back toward the gym, but possible with the pool to start, and with standing to place left leg off tension with a prop. Recommend once a week to every other week for 4-6 visits with focus on mechanical therapy, but if patient continues with symptoms to request further diagnostics of lumbar spine to make sure there are no bony growths in the lumbar spine.      A history with 3 or more personal factors and/or comorbidities that impact the plan of care.  An examination of body system(s) using standardized test and measures addressing 3-4 elements from any of the following: body structures and functions, activity limitations and/or participation restrictions.  A clinical presentation with evolving/changing characteristics.  A Moderate Complexity evaluation was performed today.    Prognosis:  Good   Contraindications/Precautions/Limitation:  Per diagnosis    Goals Established on: 03/29/23 Length Status   In 4 visits patient will consistently use corrected posture to help manage symptoms Short Term New   In 4 visits patient will patient will be able to use posture and positioning to decrease pain. Short Term New   In 4 visits patient will report improvement by 50% Short Term New   In 4 visits patient will demonstrate improved neurological exam and will report ability to walk without left LE onset of weakness in plantarflexion Short Term New   At discharge in 8 visits patient will be fully independent with home core strengthening/LE strengthening activities to improve overall physical strength to meet daily demands and will be back to the gym Long Term, discharge New   At discharge in 8  visits patient will be independent with home self-management in order to be proactive in keeping symptoms managed independent of therapy Long Term, discharge New   At discharge in 8 visits patient will demonstrate proper body mechanics to safely move through the day with minimal symptoms Long Term, discharge New   At discharge in approximately 8 visits patient will report that their patient stated functional goals are at least 80% reached and will feel confident working independent of PT Long Term, discharge New   At discharge in 8 visits patient will demonstrate a 6 point or 12% improvement in Modified Oswestry Disability Questionnaire (ODQ) score in order to demonstrate a clinically significant improvement of ADL performance and functional ability. Long Term, discharge New       Plan of Care Treatment Plan: Established on 03/29/23  Anticipated plan of care reviewed with patient and/or family:  Yes  Freq 1-2 times per week or every other week for 8 visit(s) and will complete progress note to MD    Treatment plan inclusive of:   Exercise: AROM, Stretching, Strengthening, Progressive Resistive   Manual Techniques:  Joint mobilization, Soft tissue release/massage   Modalities: N/A   Functional: Functional rehab, Postural training, Self-care, Home management  CPTs: Therapeutic Exercise (97110), Manual Therapy (16109), Self-care/Home Management (60454), and Physical Performance Test [phone])    Treatment today consisted of:    Treatment: Details    Insurance details 2024 EXCELLUS: 45 MAX combined  POC/Visits 8 STG 3-4 visits and LTG 6-8 visits   Mechanical Positioning  Prone lying to press ups and overpressure  Dural glides with belted SLR, only 5 reps   THERAPEUTIC EXERCISE                            MANUAL  NEUROMUSCULAR RE-ED            THERAPEUTIC ACTIVITY    Sitting and standing postures Sitting with lumbar support  Stand with left foot propped.        GAIT        MODALITIES            OTHER    ROM       Thank you for referring this patient to [facility].    Gerald Dexter, PT       Time Reporting    Service-Based Procedures/ Modalities    Evaluation (high, moderate, low) 45   Re-evaluation    Traction, mechanical    Electric stimulation (unattended)    Total service-based billable procedures 1 unit 45 minutes Moderate complexity       Time-Based  Procedures / Modalities    Therapeutic ex 10   Neuromuscular Re-ed    Manual Therapy    Therapeutic Activities 5   Gait training, including stairs    Ultrasound    Electrical Stimulation    Iontophoresis    Physical Performance Test    Aquatic Therapy    Total Time-Based Billable Procedures 15       Total Treatment Time 60

## 2023-04-04 ENCOUNTER — Ambulatory Visit: Payer: No Typology Code available for payment source | Admitting: Physical Therapy

## 2023-04-04 NOTE — Progress Notes (Deleted)
er

## 2023-04-05 ENCOUNTER — Other Ambulatory Visit: Payer: Self-pay

## 2023-04-05 ENCOUNTER — Ambulatory Visit: Payer: No Typology Code available for payment source | Attending: Orthopedic Surgery | Admitting: Physical Therapy

## 2023-04-05 DIAGNOSIS — M5416 Radiculopathy, lumbar region: Secondary | ICD-10-CM | POA: Insufficient documentation

## 2023-04-05 DIAGNOSIS — M898X9 Other specified disorders of bone, unspecified site: Secondary | ICD-10-CM | POA: Insufficient documentation

## 2023-04-05 NOTE — Progress Notes (Signed)
[facility]  Part of [facility]  Daily Note    Name: Nicoli Grahan  DOB: [birth year]  Referring Physician: Carin Hock*  Today's Date: 04/05/2023  Visit #: 2    Diagnosis:     ICD-10-CM ICD-9-CM   1. Heterotopic ossification of bone  M89.8X9 733.99   2. Left lumbar radiculopathy  M54.16 724.4        Subjective:    Patient reports he would say he is better. Sleeping is easier, able to sit and relax easier with less fussing especially at night when trying to relax at night. Sat and watched a movie. Pain continues left buttock into thigh, lateral calf is numbish and lateral foot feels numb. Found less sensation of weakness in left foot with walking. Discovered in prone that he likes to pull the left leg up as it gives some relief.     Objective:     Treatment today consisted of:    Treatment: Details    Insurance details 2024 EXCELLUS: 45 MAX combined  POC/Visits 8 STG 3-4 visits and LTG 6-8 visits   Mechanical Positioning/Manual joint mobs Prone lying and added left LE off tension with this reports the left posterior thigh relaxed immediately. Kept patient in position with moist heat X 6 minutes    Press ups keeping left LE off tension X 10 and trial with regular press up finding the regular press up more effective.    PA mobs X 10 L4/5 and L5/S1 level and the unilateral with positive result unilateral left. Completed grade 3 unilateral joint mobs lower lumbar segments and trial of self overpressure left using tennis ball with positive result. Less pain through thigh.       Advised to hold on Dural glides with belted SLR for now and focus on the postural control and use of extension.    THERAPEUTIC EXERCISE                            Sitting and standing postures Sitting with lumbar support  Stand with left foot propped. Use of standing extension with leg propped.        OTHER    ROM        Assessment:  Patient with less pain and weakness in the left  leg. Symptoms continue through the left leg and there is numbness which is concerning but overall symptoms are less.       Plan of Care: Patient doesn't return until 04/18/23. Will check and continue.     Thank you for referring this patient to [facility].    Gerald Dexter, PT       Time Reporting Minutes   Service-Based Procedures/ Modalities    Evaluation (high, moderate, low)    Re-evaluation    Traction, mechanical    Electric stimulation (unattended)    Total service-based billable procedures        Time-Based  Procedures / Modalities    Therapeutic ex 25   Neuromuscular Re-ed    Manual Therapy 10   Therapeutic Activities    Gait training, including stairs    Ultrasound    Electrical Stimulation    Iontophoresis    Physical Performance Test    Aquatic Therapy    Total Time-Based Billable Procedures 35       Total Skilled Treatment Time 35

## 2023-04-18 ENCOUNTER — Ambulatory Visit: Payer: No Typology Code available for payment source | Admitting: Physical Therapy

## 2023-04-18 ENCOUNTER — Other Ambulatory Visit: Payer: Self-pay

## 2023-04-18 DIAGNOSIS — M898X9 Other specified disorders of bone, unspecified site: Secondary | ICD-10-CM

## 2023-04-18 DIAGNOSIS — M5416 Radiculopathy, lumbar region: Secondary | ICD-10-CM

## 2023-04-18 NOTE — Progress Notes (Signed)
[facility]  Part of [facility]  Daily Note    Name: Darren Owens  DOB: December 23, 1970  Referring Physician: Carin Hock*  Today's Date: 04/18/2023  Visit #: 3    Diagnosis:     ICD-10-CM ICD-9-CM   1. Heterotopic ossification of bone  M89.8X9 733.99   2. Left lumbar radiculopathy  M54.16 724.4        Subjective:    Patient reports he is taking less pain medication. Noticing more awareness of the left plantarflexion weakness. Isn't sure if it is weaker or if he is just more aware of the weakness. Currently with symptoms through the left LE with aching and numbness in the posterior thigh, lower leg is tingling and the foot feels like he has a bunched up sock and the foot feels weak with walking. Feels overall a little better, but still with weakness, numbness and tingling.     Objective:     Treatment today consisted of:    Treatment: Details    Insurance details 2024 EXCELLUS: 45 MAX combined  POC/Visits 8 STG 3-4 visits and LTG 6-8 visits   Mechanical Positioning/Manual joint mobs Side lying left with lateral correction roll with rotation X 10 and the sustained rotation X 2 minutes Reports less symptoms distal to the knee.     PA mobs X 10 L4/5 and L5/S1 and reports better, then PA mobs with pressups and reports better with less symptoms through the leg. Discussed with patient and showed him position for spouse to do overpressure. He feels this is better than with the towel      Light Dural glides with hip and knee flexed with knee extension X 5-10 reps and then dorsiflexion, inversion, eversion.    THERAPEUTIC EXERCISE                            Sitting and standing postures Sitting with lumbar support  Stand with left foot propped. Use of standing extension with leg propped.        OTHER    ROM        Assessment:  Patient with ongoing left LE symptoms and more awareness of the weakness in the left plantarflexors. He is doing the exercises and  working on postural control, but leg symptoms continue.        Plan of Care: Patient doesn't return until 05/06/23. If at that time symptoms continue will advise him to return to MD.     Thank you for referring this patient to [facility].    Gerald Dexter, PT       Time Reporting Minutes   Service-Based Procedures/ Modalities    Evaluation (high, moderate, low)    Re-evaluation    Traction, mechanical    Electric stimulation (unattended)    Total service-based billable procedures        Time-Based  Procedures / Modalities    Therapeutic ex 20   Neuromuscular Re-ed    Manual Therapy 15   Therapeutic Activities    Gait training, including stairs    Ultrasound    Electrical Stimulation    Iontophoresis    Physical Performance Test    Aquatic Therapy    Total Time-Based Billable Procedures 35       Total Skilled Treatment Time 35

## 2023-05-06 ENCOUNTER — Ambulatory Visit: Payer: No Typology Code available for payment source | Admitting: Physical Therapy

## 2023-05-17 ENCOUNTER — Ambulatory Visit: Payer: No Typology Code available for payment source | Attending: Orthopedic Surgery | Admitting: Physical Therapy

## 2023-05-17 ENCOUNTER — Other Ambulatory Visit: Payer: Self-pay

## 2023-05-17 ENCOUNTER — Other Ambulatory Visit: Payer: Self-pay | Admitting: Family Medicine

## 2023-05-17 ENCOUNTER — Ambulatory Visit
Admission: RE | Admit: 2023-05-17 | Discharge: 2023-05-17 | Disposition: A | Discharge status: Home / Self Care | Payer: No Typology Code available for payment source | Source: Ambulatory Visit | Attending: Family Medicine | Admitting: Family Medicine

## 2023-05-17 DIAGNOSIS — M4726 Other spondylosis with radiculopathy, lumbar region: Secondary | ICD-10-CM | POA: Insufficient documentation

## 2023-05-17 DIAGNOSIS — M5417 Radiculopathy, lumbosacral region: Secondary | ICD-10-CM

## 2023-05-17 DIAGNOSIS — M898X9 Other specified disorders of bone, unspecified site: Secondary | ICD-10-CM | POA: Insufficient documentation

## 2023-05-17 DIAGNOSIS — M5416 Radiculopathy, lumbar region: Secondary | ICD-10-CM | POA: Insufficient documentation

## 2023-05-17 NOTE — Progress Notes (Signed)
[facility]     Part of [facility]  Discharge Note    Name: Darren Owens  DOB: 10-Mar-1971  Referring Physician: Carin Hock*  Today's Date: 05/17/2023  Visit #: 4    Reporting Period: 03/29/23 to 05/17/23    Diagnosis:     ICD-10-CM ICD-9-CM   1. Heterotopic ossification of bone  M89.8X9 733.99   2. Left lumbar radiculopathy  M54.16 724.4       SUBJECTIVE:  Darren Owens is a 52 y.o. male who is present today for Lumbar radiculopathy.      Patient reports - He isn't making any changes continues with left thigh throbbing with sitting. Foot and ankle are weak with walking. He walks and foot gets numb, achy, and very tired.     PT has received care for 4 visits to date.   Attendance:  Good    Compliance:  Good       Symptoms:  Symptom location: Left lumbar, left buttocks and posterior thigh, left lateral calf to the foot  Relevant symptoms:  Nerve pain and numbness in foot. Onset of weakness in plantarflexion with walking  Symptom frequency: Constant    Pain intensity:   Date 05/17/23 Date 03/29/23   Current Left Lumbar pain - 2/10  Left Posterior Thigh - 6/10  Left Lower leg and lateral Pain - 5/10 Left Lumbar pain - 5/10  Left Posterior Thigh - 3/10  Left Lower leg and lateral Pain - 5/10   Best Left Lumbar pain - 0/10  Left Posterior Thigh - 3/10  Left Lower leg and lateral Pain - 5/10 Left Lumbar pain - 0/10  Left Posterior Thigh - 2/10  Left Lower leg and lateral Pain - 5/10   Worse Left Lumbar pain - 8/10  Left Posterior Thigh - 8/10  Left Lower leg and lateral Pain - 9/10 Left Lumbar pain - 7/10 Sitting  Left Posterior Thigh - 7/10 Sitting  Left Lower leg and lateral Pain 9/10 walking and foot gets weak.      Acceptable Pain Level 0    Night Pain: Yes   Restful sleep:   No Tries to sleep sidelying with pillow between legs.   Morning Pain/Stiffness:  First 30 minutes feels better and then immediately starts with sitting and moving.    Symptoms worsen with: Sitting for back, standing and waking for left leg  Symptoms improve with: Medication helps a little.     Prior/Current Function  Prior level of function: Pt reports no limitations with performance of ADLs or functional activities prior to onset of current sxs/surgery.    Assistive device:  none    Pt. stated goals: As reported on 03/29/23 Reduce the pain                               As reported on 05/17/23 When he does the positioning and exercise he gets relief, but with weightbearing positions symptoms are returned.     Functional Limitations  Activity Limitation 05/17/23 Limitation 03/29/23     Sitting Moderate Limitation - Left thigh throbs. Has to stand through the day.  Moderate Limitation   Walking/standing Mild to Moderate Limitation - feels the stretching has given him better ROM. The progressive weakness into the foot is concerning.  Mild limitation - progressively worsens.           Outcome:    On Discharge Note  Date 05/17/23 On Initial Eval Date:03/29/23   Revised Oswestry 44% perceived disability 22% perceived disability (was on a medrol pack)        Objective    Posture:  Mild loss of lumbar lordosis  Palpation:  unremarkable @ spine  Incision:  NA    Lumbar Spine AROM (Percent Loss)    DATE: 05/17/23   Flexion No loss   Extension No loss   Right Sidebending No loss   Left Sidebending No loss   Myotomes   Right Left   L1,2 - Psoas 5/5 5/5   L3 - Quadriceps 5/5 5/5   L4 - Anterior Tibialis 5/5 5/5   L5 - Peroneals Peroneals 5/5 Peroneals 4/5   S1, 2 - Gastroc Gastrocs 5/5 Gastrocs -Unable to lift to toes.      Dermatomes Not tested    Reflexes   Right Left   L3 - Patellar 1+ 1+   S1 - Achilles 0 0     Lumbar Special Testing: Straight Leg test: Right negative, Left positive    Lumbar Joint Mobility:    Joint Force Direction Grade End Feel Symptoms   L3 P/A Central III Normal Status quo   L4 P/A Central III Normal Improves   L5 P/A Central III Normal Improves     Repeated Motions:  Symptoms to Start Left thigh and back  Motion Number of reps/time Symptom Response Sign Response Comments   Prone through press ups and press ups with overpressure 2 minutes prone and pressups X 10 and with overpressure X 10 Better less pain Unchanged.                       Assessment:  Patient has been to therapy for 4 visits, working with home ROM activities with mechanical therapy and postural control. At this time he reports that the symptoms into the left LE continue. He has started Gabapentin through his primary MD. Even with this symptoms continue to elevate through the left LE especially into the foot with numbness and weakness described. His issue has a lumbar component. He is advised to return to MD for further assessment of the lumbar spine. Recommend further diagnostic testing. He is discharged from therapy as he hasn't progressed.     Prognosis:  Good   Contraindications/Precautions/Limitation:  Per diagnosis    Goals Established on: 03/29/23 and reviewed on 05/17/23 Length Status   In 4 visits patient will consistently use corrected posture to help manage symptoms Short Term He is working on postural control   In 4 visits patient will patient will be able to use posture and positioning to decrease pain. Short Term MET as the press ups do give him some relief, but doesn't remain   In 4 visits patient will report improvement by 50% Short Term No Met states he would be hard press to feel 10% better   In 4 visits patient will demonstrate improved neurological exam and will report ability to walk without left LE onset of weakness in plantarflexion Short Term Not MET   At discharge in 8 visits patient will be fully independent with home core strengthening/LE strengthening activities to improve overall physical strength to meet daily demands and will be back to the gym Long Term, discharge Not MET   At discharge in 8 visits patient will be independent with home self-management in order to be proactive in  keeping symptoms managed independent of therapy Long Term, discharge Not MET   At  discharge in 8 visits patient will demonstrate proper body mechanics to safely move through the day with minimal symptoms Long Term, discharge Not MET   At discharge in approximately 8 visits patient will report that their patient stated functional goals are at least 80% reached and will feel confident working independent of PT Long Term, discharge Not MET   At discharge in 8 visits patient will demonstrate a 6 point or 12% improvement in Modified Oswestry Disability Questionnaire (ODQ) score in order to demonstrate a clinically significant improvement of ADL performance and functional ability. Long Term, discharge Not MET       Plan of Care Treatment Plan: Discharge     Treatment today consisted of:    Treatment: Details    Insurance details 2024 EXCELLUS: 45 MAX combined  POC/Visits 8 STG 3-4 visits and LTG 6-8 visits   Mechanical Positioning/Manual joint mobs   PA mobs X 10 L4/5 and L5/S1 and reports better, then PA mobs with pressups and reports better with less symptoms through the leg. Discussed with patient and showed him position for spouse to do overpressure. He feels this is better than with the towel      Light Dural glides with hip and knee flexed with knee extension X 5-10 reps and then dorsiflexion, inversion, eversion.    THERAPEUTIC EXERCISE                            Sitting and standing postures Sitting with lumbar support  Stand with left foot propped. Use of standing extension with leg propped.        OTHER    ROM      Thank you for referring this patient to [facility].    Gerald Dexter, PT       Time Reporting    Service-Based Procedures/ Modalities    Evaluation (high, moderate, low)    Re-evaluation    Traction, mechanical    Electric stimulation (unattended)    Total service-based billable procedures        Time-Based  Procedures / Modalities    Therapeutic ex 30  including information for discharge   Neuromuscular Re-ed    Manual Therapy 5   Therapeutic Activities    Gait training, including stairs    Ultrasound    Electrical Stimulation    Iontophoresis    Physical Performance Test    Aquatic Therapy    Total Time-Based Billable Procedures 35       Total Treatment Time 35

## 2023-05-17 NOTE — Result Encounter Note (Signed)
[facility] patient, will address in their system

## 2023-07-14 ENCOUNTER — Ambulatory Visit: Payer: No Typology Code available for payment source | Attending: Ophthalmology | Admitting: Ophthalmology

## 2023-07-14 ENCOUNTER — Other Ambulatory Visit: Payer: Self-pay

## 2023-07-14 ENCOUNTER — Encounter: Payer: Self-pay | Admitting: Ophthalmology

## 2023-07-14 DIAGNOSIS — H524 Presbyopia: Secondary | ICD-10-CM | POA: Insufficient documentation

## 2023-07-14 NOTE — Progress Notes (Signed)
 Assessment/Plan:      1. Presbyopia             PLAN:    New glasses prescription dispensed or continue with current.  New contact lens prescription dispensed.

## 2023-07-25 ENCOUNTER — Telehealth: Payer: Self-pay | Admitting: Orthopedic Surgery

## 2023-07-25 NOTE — Telephone Encounter (Signed)
Spoke with Raiford Noble, confirmed rescheduled appt from 10/24 to 10/28 w/ Dr. Diona Browner

## 2023-08-10 ENCOUNTER — Encounter: Payer: Self-pay | Admitting: Gastroenterology

## 2023-08-10 ENCOUNTER — Encounter: Payer: Self-pay | Admitting: Family Medicine

## 2023-08-10 ENCOUNTER — Other Ambulatory Visit: Payer: Self-pay | Admitting: Family Medicine

## 2023-08-10 DIAGNOSIS — M5416 Radiculopathy, lumbar region: Secondary | ICD-10-CM

## 2023-08-31 ENCOUNTER — Encounter: Payer: No Typology Code available for payment source | Admitting: Pain Medicine

## 2023-09-20 ENCOUNTER — Ambulatory Visit
Admission: RE | Admit: 2023-09-20 | Discharge: 2023-09-20 | Disposition: A | Discharge status: Home / Self Care | Payer: No Typology Code available for payment source | Source: Ambulatory Visit | Attending: Family Medicine | Admitting: Family Medicine

## 2023-09-20 ENCOUNTER — Other Ambulatory Visit: Payer: Self-pay

## 2023-09-20 DIAGNOSIS — M4727 Other spondylosis with radiculopathy, lumbosacral region: Secondary | ICD-10-CM | POA: Insufficient documentation

## 2023-09-20 DIAGNOSIS — M5416 Radiculopathy, lumbar region: Secondary | ICD-10-CM

## 2023-09-20 DIAGNOSIS — M4726 Other spondylosis with radiculopathy, lumbar region: Secondary | ICD-10-CM | POA: Insufficient documentation

## 2023-09-21 NOTE — Result Encounter Note (Signed)
[facility] patient, will address in their system

## 2023-09-22 ENCOUNTER — Ambulatory Visit: Payer: No Typology Code available for payment source | Admitting: Orthopedic Surgery

## 2023-09-26 ENCOUNTER — Ambulatory Visit: Payer: No Typology Code available for payment source | Attending: Orthopedic Surgery | Admitting: Orthopedic Surgery

## 2023-09-26 ENCOUNTER — Ambulatory Visit
Admission: RE | Admit: 2023-09-26 | Discharge: 2023-09-26 | Disposition: A | Discharge status: Home / Self Care | Payer: No Typology Code available for payment source | Source: Ambulatory Visit

## 2023-09-26 ENCOUNTER — Other Ambulatory Visit: Payer: Self-pay

## 2023-09-26 VITALS — BP 120/88 | HR 93 | Ht 69.0 in | Wt 280.0 lb

## 2023-09-26 DIAGNOSIS — M898X9 Other specified disorders of bone, unspecified site: Secondary | ICD-10-CM

## 2023-09-26 DIAGNOSIS — M898X5 Other specified disorders of bone, thigh: Secondary | ICD-10-CM

## 2023-09-26 DIAGNOSIS — M1611 Unilateral primary osteoarthritis, right hip: Secondary | ICD-10-CM

## 2023-09-26 NOTE — Progress Notes (Signed)
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  At the time he was provided a Medrol Dosepak which did significantly alleviate his left lower extremity symptoms although they have since recurred.  Symptoms continue to be shooting/radiating pain starting from the lateral buttock down his lateral thigh and calf into left lateral foot.  He also reports some hypersensitivity in this distribution.  An MRI has since been obtained as ordered through his primary care and he has since been referred to the pain management center at [facility] which she is scheduled to see on 10/30.    Physical Examination:  BP 120/88   Pulse 93   Ht 1.753 m (5\' 9" )   Wt 127 kg (280 lb)   BMI 41.35 kg/m   General:  Awake and alert in no distress.  Appears at stated age.  Psych:  Affect is appropriate.  Respirations:  Unlabored effort, No audible wheeze.  CV:  Regular rate and rhythm  MSK:  An examination of Bilateral lower extremities.  Negative straight leg leg raise bilaterally except for some hamstring tightness on the left side.  5/5 strength with resisted knee extension, EHL, FHL, ADF, APF.  Sensation intact distally on examination today.    Imaging: Bilateral femur films obtained today and personally reviewed.  Demonstrates evidence of stable appearance of bilateral thigh heterotopic ossification left proximal femur cortical bone formation    Impression/Plan:    Patient did have some improvement with his left lower extremity likely radicular symptoms with a steroid course but unfortunately the symptoms have recurred and continue to be troublesome.  He is scheduled to see the pain management center at [facility] regarding this.  With regards to his heterotopic ossification, these appear stable in appearance as compared to prior radiographs from April of this  past year.  Plan for follow-up in 1 year for repeat bilateral femur films.       Gracy Racer, MD  Orthopaedic Surgery Resident  09/26/2023    I saw and evaluated the patient. I have reviewed and edited the resident's/fellow's note and confirm the findings and plan of care as documented.    Christa See, MD

## 2023-09-28 ENCOUNTER — Encounter: Payer: Self-pay | Admitting: Pain Medicine

## 2023-09-28 ENCOUNTER — Other Ambulatory Visit: Payer: Self-pay

## 2023-09-28 ENCOUNTER — Ambulatory Visit: Payer: No Typology Code available for payment source | Attending: Pain Medicine | Admitting: Pain Medicine

## 2023-09-28 VITALS — BP 131/93 | HR 82 | Temp 98.1°F

## 2023-09-28 DIAGNOSIS — M5417 Radiculopathy, lumbosacral region: Secondary | ICD-10-CM

## 2023-09-28 NOTE — Progress Notes (Addendum)
Anesthesiology Pain Consult Note  [facility]    This is a confidential report written only for the purpose of professional communication.  Clients who wish to receive these findings are requested to contact the [facility] at [facility] [phone].       Jamus Recla is a 52 y.o. year old male.  DOB: [birth year]  Primary Care Physician: [facility], [facility]  Outpatient pain medication prescriber: [facility], [facility]  Chief Complaint: low back pain  Consult requested by: Virgilio Belling, MD   Reason for Consultation: management recommendations, evaluation for an interventional procedure    History of Present Illness:Rick Mcannally is a 51 y.o. year-old male with hx of HTN, HLD, obesity. He presents for initial evaluation of low back pain.  His pain started about one year ago. He cannot recall any history of injury. It has initially started on the right , but then moved to the left side. He has tried Physical Therapy without success.    His pain is located in the left gluteal area and radiates to the left posterior leg and plantar aspect of the left foot. The pain is present constantly. He describes the pain as dull, sharp. Aggravating factors include sitting, standing. Alleviating factors include lying down.  Patient  reports weakness in the left leg and  numbness/tingling in the bottom of the left foot. Pain interferes with sleep. Patient denies recent bowel or bladder dysfunction, saddle anesthesia, or loss of balance.    Information in this section was obtained from the direct interview with a patient and review of records.    Pain scores:  Pain rating today: 8/10  Pain Range in Past week: 7/10 to9/10    Functional Level:   - independent mobility without assistive devices.  - independent ADL performance without assistance.    Treatment History    PT -03/2023 to 05/2023  Interventional procedures -has not tried       Pharmacological  A) Present Pain Medications &  Effectiveness    Naproxen as needed  Gabapentin 300 mg three times daily    B) Historical Experience with Pain Related Medications & Effectiveness:      Not tried  Lyrica/Topamax/Carbamazepine/Oxcarbazepine  Meloxicam/ibuprofen/diclofenac/etodolac/nabumetone/diflunisal/Celebrex  Lidocaine/Voltaren/Flector/Salonpas  Flexeril/Robaxin/Skelaxin/Tizanidine/Baclofen/Norflex  Duloxetine/venlafaxine/amitriptyline/nortriptyline  Norco/Percocet/Oxy/Morphine/Dilaudid/Tramadol/Tapentadol  Methadone/Suboxone/Fentanyl      Past Medical History  Past Medical History:   Diagnosis Date    Hypertension        Currently Active Problems  Patient Active Problem List   Diagnosis Code    Essential hypertension I10    Obesity E66.9    Pure hypercholesterolemia E78.00       Past Surgical History  Past Surgical History:   Procedure Laterality Date    CERVICAL SPINE SURGERY      PR COLONOSCOPY FLX DX W/COLLJ SPEC WHEN PFRMD N/A 11/20/2019    Procedure: COLONOSCOPY;  Surgeon: Rolla Flatten, MD;  Location: [facility];  Service: GI       Family History  Family History   Problem Relation Age of Onset    Diabetes Mother     Glaucoma Mother     Cancer Maternal Grandmother        Social History  Social History     Socioeconomic History    Marital status: Married   Tobacco Use    Smoking status: Never    Smokeless tobacco: Never   Substance and Sexual Activity    Alcohol use: Yes     Comment: occasionally  Drug use: Never    Sexual activity: Yes     Partners: Female         Employment: sits/stands at work    Allergies:   Allergies   Allergen Reactions    Shellfish-Derived Products Anaphylaxis       Current Meds:    Current Outpatient Medications   Medication    aspirin (ASPIRIN EC) 81 MG EC tablet    cloNIDine (CATAPRES) 0.1 MG tablet    amLODIPine-atorvastatatin (CADUET) 5-10 MG per tablet    losartan-hydrochlorothiazide (HYZAAR) 50-12.5 MG per tablet    methylPREDNISolone (MEDROL PAK) 4 MG tablet pack    tadalafil (CIALIS) 10 MG  tablet     No current facility-administered medications for this visit.       Review of Systems   Constitutional:  Negative for chills and fever.   HENT:  Negative for congestion and sore throat.    Respiratory:  Negative for cough and shortness of breath.    Cardiovascular:  Negative for chest pain and palpitations.   Gastrointestinal:  Negative for abdominal pain.   Musculoskeletal:  Positive for back pain.   Skin:  Negative for rash.   Neurological:  Negative for tremors and seizures.   Psychiatric/Behavioral:  Negative for depression. The patient is not nervous/anxious.           Prior Investigations/Imaging:  -     Narrative   09/20/2023 11:09 AM    MR LUMBAR SPINE WITHOUT CONTRAST    FINDINGS:    For the purpose of this study, the last lumbar type vertebra is labeled L5.    Lumbar lordosis is present. Alignment is unremarkable.  Lumbar vertebrae are normal in height. Age-appropriate marrow changes. Multilevel disc desiccation and disc height loss.    Individual levels:    T12-L1: No posterior disc abnormality. Bilateral facet arthropathy. Ligamentum flavum thickening. No spinal canal stenosis or neural foraminal narrowing.  L1-2:No posterior disc abnormality. Bilateral facet arthropathy. Ligamentum flavum thickening. No spinal canal stenosis or neural foraminal narrowing.  L2-3:No posterior disc abnormality. Bilateral facet arthropathy. Ligamentum flavum thickening. No spinal canal stenosis or neural foraminal narrowing.  L3-4: Diffuse disc bulge. Bilateral facet arthropathy. Ligamentum flavum thickening. No significant spinal canal stenosis or neural foraminal narrowing.  L4-5: Diffuse disc bulge. Bilateral facet arthropathy. Ligamentum flavum thickening. No significant spinal canal stenosis or neural foraminal narrowing.  L5-S1: Diffuse disc bulge with superimposed central/left subarticular zone disc protrusion resulting in effacement of the left lateral recess. The disc likely abuts the left S1 nerve.  Bilateral facet arthropathy. At least moderate spinal canal stenosis.  Note that the thecal sac markedly tapers at this level. Mild bilateral neural foraminal narrowing.    Unremarkable appearance of the visualized spinal cord, which terminates at T12-L1.  Visualized prevertebral soft tissues are unremarkable.    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    END OF IMPRESSION      09/26/2023 10:14 AM    RIGHT FEMUR X-RAYS    FINDINGS/   Impression   There is no acute osseous or articular abnormality. Mild degenerative changes are seen at the right hip and knee. Stable heterotopic calcification are noted in the right thigh.          END OF IMPRESSION     09/26/2023 10:13 AM    LEFT FEMUR X-RAYS    FINDINGS/   Impression     Healed fracture deformity  of the mid femur. Stable heterotopic ossification along the medial aspect of the femur. Degenerative changes at the knee. No acute findings. No significant interval change.    END OF IMPRESSION     09/26/2023 10:14 AM    RIGHT FEMUR X-RAYS      FINDINGS/   Impression   There is no acute osseous or articular abnormality. Mild degenerative changes are seen at the right hip and knee. Stable heterotopic calcification are noted in the right thigh.       Lab Results:  Chemistry    Lab Results   Component Value Date/Time    Sodium 141 04/07/2019 0000    Potassium 3.0 (L) 04/07/2019 0000    Chloride 100 04/07/2019 0000    CO2 28 04/07/2019 0000    Anion Gap 13 04/07/2019 0000    UN 17 04/07/2019 0000    GFR,Caucasian 70 04/07/2019 0000    GFR,Black 81 04/07/2019 0000    Glucose 134 (H) 04/07/2019 0000    Calcium 9.2 04/07/2019 0000    Total Protein 7.3 04/07/2019 0000     Hematology:    Lab Results   Component Value Date/Time    WBC 4.5 04/07/2019 0000    RBC 4.3 (L) 04/07/2019 0000    Hemoglobin 13.1 (L) 04/07/2019 0000    Hematocrit 38 (L) 04/07/2019 0000    MCV 87 04/07/2019 0000    RDW 12.4 04/07/2019 0000     Neut # K/uL 1.7 (L) 04/07/2019 0000    Lymph # K/uL 2.0 04/07/2019 0000    Mono # K/uL 0.6 04/07/2019 0000    Eos # K/uL 0.2 04/07/2019 0000    Baso # K/uL 0.0 04/07/2019 0000       Physical Exam:  Pain    09/28/23 0816   PainSc:   9   PainLoc: Leg      Vital Signs:   Vitals:    09/28/23 0816   BP: (!) 131/93   Pulse: 82   Temp: 36.7 C (98.1 F)           General:  Awake, Alert, in No apparent distress sitting in examination room    Musculoskeletal Examination:   - AROM in lumbar spine is 50% of normal. PROM in lower extremities is full  - Normal muscle tone, no spasticity.  Gait:  - antalgic    Special Tests:  - positive seated straight leg raise on the left      Neurological Examination:  Mental status: alert, awake, and oriented x3.  Sensory:  - LT sensation of lower limbs is intact bilaterally, with exception of decreased sensation to light touch over the area of the left lateral shin      Motor strength:   Motor strength  Right  Left    L2  Hip flexors  5 5   L3  Knee extensors  5 5   L4  Ankle dorsiflexors  5 5   L5  Extensor hallucis longus  5 5   S1  Ankle plantar flexors  5 5          Opioid Risk Assessment:   Male Male   Family History of Substance Abuse     Alcohol []  1 []  3   Illegal Drugs []  2 []  3   Rx Drugs []  4 []  4     Personal History of Substance Abuse     Alcohol []  3 []  3   Illegal Drugs []  4 []  4  Rx Drugs []  5 []  5        Age Between 33-45 []  1 []  1        History of Preadolescent Sexual Abuse []  3 []  0     Pyschologic Disease     ADD, OCD, Bipolar, Schizoprenia []  2 []  2   Depression []  1 []  1     Total = 0    Low risk 0-3   Moderate risk 4-7   High risk >8       Assessment/Plan:   Sourish Monier is a 52 y.o. year-old male with hx of HTN, HLD, obesity. He presents for initial evaluation of low back pain.  His pain started about one year ago. He cannot recall any history of injury. It has initially started on the right , but then moved to the left side. He has tried Physical Therapy  without success.  His symptoms and physical examination are consistent with lumbar radiculopathy. We have discussed our recommendations with the patient. We recommend:      Interventions:  A trial of TFESI at the left L5/S1 and S1 foramen      Medical Necessity:  The pain began one year ago  The pain is currently rated  8-9/10.  Pain is secondary to lumbar radiculopathy.  The pain has been refractory to conservative measures including:  - More than three months of conservative treatment with rest.  - More than three months of reduced activity.   - Trial of HE/physical therapy/chiropractic care, Dates: 03/2023 to 05/2023  with HE. The patient was evaluated by the provider after completing treatment.   - Home exercise program utilizing exercises learned in prior physical therapy sessions or  - Home exercise program prescribed by licensed healthcare professional.  - Trial of medications for at least 3 months including:naproxen, gabapentin..  - Patient has failed to respond to conservative therapy including all of the following >6 weeks chiropractic, physical therapy and prescribed home exercise program  and NSAID > 3 weeks  and > 6 weeks activity modification.  -The procedure will be completed with the aid of fluoroscopic guidance.  -Impact on activities of daily living: The pain is significantly impacting quality life and ability to participate in activities of daily living including; cooking, cleaning, household chores, pursuit of hobbies and gainful employment.  -The clinical findings and imaging studies suggest no other obvious cause of the pain. The history, physical examination, and clinical correlation was performed for the diagnosis of this pain syndrome by an experienced pain medicine provider.  -Please note that the [facility] is a comprehensive, multidisciplinary pain treatment program that includes coordination of physical therapy, patient education, psychosocial support, oral medication and  injection based therapies.       Follow up:  For procedure    Risks and benefits were explained. Patient has verbalized understanding and agreed to proceed with the proposed treatment plan.    Patient was encouraged to contact us for any worsening symptoms, persisting symptoms, or any other concerns.  Patient was provided the opportunity to ask questions.  Thank you for allowing [place] the opportunity to care for this patient.    Patient was discussed, examined, and treatment plan developed with the attending physician. Final note was reviewed and edited by Dr. Erven Colla, MD.    Dictated by: Mendel Corning, PA  09/28/2023       ADDENDUM:  I have reviewed the PA note above and was present for key components of the  visit. I have edited the note as appropriate.  The substantive portion of the visit, including the medical decision making, was performed by me, that encompassed the risk of complications and/or morbidity or mortality from the presenting problem, diagnostic tests ordered and treatment options and alternatives.       The options for procedural intervention were discussed by me in detail outlining the risks and benefits inherent in such intervention. Will proceed with TFESI @ left L5/S1 and S1 foramen. Remainder per above.      Laureen Abrahams, MD, MS  Associate Professor  Pain Division   Department of Anesthesiology & Perioperative Medicine  [facility]  09/28/23  9:20 AM

## 2023-10-26 NOTE — Progress Notes (Signed)
Per Dr. Marylouise Stacks, no hold ASA 81mg  prior to TFESI Left L5/S1 and S1.

## 2023-11-08 ENCOUNTER — Ambulatory Visit: Payer: No Typology Code available for payment source | Attending: Pain Medicine | Admitting: Pain Medicine

## 2023-11-08 ENCOUNTER — Encounter: Payer: Self-pay | Admitting: Pain Medicine

## 2023-11-08 ENCOUNTER — Other Ambulatory Visit: Payer: Self-pay | Admitting: Pain Medicine

## 2023-11-08 ENCOUNTER — Ambulatory Visit
Admission: RE | Admit: 2023-11-08 | Discharge: 2023-11-08 | Disposition: A | Discharge status: Home / Self Care | Payer: No Typology Code available for payment source | Source: Ambulatory Visit | Attending: Pain Medicine | Admitting: Pain Medicine

## 2023-11-08 ENCOUNTER — Other Ambulatory Visit: Payer: Self-pay

## 2023-11-08 VITALS — BP 142/95 | HR 92 | Temp 98.2°F | Resp 18

## 2023-11-08 DIAGNOSIS — R52 Pain, unspecified: Secondary | ICD-10-CM

## 2023-11-08 DIAGNOSIS — M5417 Radiculopathy, lumbosacral region: Secondary | ICD-10-CM | POA: Insufficient documentation

## 2023-11-08 MED ORDER — METHYLPREDNISOLONE ACETATE 40 MG/ML IJ SUSP *I*
80.0000 mg | Freq: Once | INTRAMUSCULAR | Status: AC | PRN
Start: 2023-11-08 — End: 2023-11-08
  Administered 2023-11-08: 80 mg

## 2023-11-08 MED ORDER — LIDOCAINE HCL 1% IJ SOLN (PF) (AMB) *I*
2.0000 mL | Freq: Once | INTRAMUSCULAR | Status: AC | PRN
Start: 2023-11-08 — End: 2023-11-08
  Administered 2023-11-08: 2 mL

## 2023-11-08 MED ORDER — IOHEXOL 240 MG/ML (OMNIPAQUE) IV SOLN *I*
3.0000 mL | Freq: Once | INTRAMUSCULAR | Status: AC | PRN
Start: 2023-11-08 — End: 2023-11-08
  Administered 2023-11-08: 3 mL

## 2023-11-08 MED ORDER — LIDOCAINE HCL 1 % IJ SOLN *I*
5.0000 mL | Freq: Once | INTRAMUSCULAR | Status: AC | PRN
Start: 2023-11-08 — End: 2023-11-08
  Administered 2023-11-08: 5 mL via SUBCUTANEOUS

## 2023-11-08 NOTE — Procedures (Signed)
Date/Time: 11/08/2023  2:30 PM EST  Epidural Steroid Injection(s): Transforaminal - lumbar or sacral 1st level - CPT [id number] and Transforaminal - lumbar or sacral 2nd additional level - CPT 64484  Laterality: Left  Contrast - Left: 3 mL iohexol 240 MG/ML  Anesthetic medication - Left:  5 mL lidocaine HCL 1 %; 2 mL Lidocaine HCl (PF) 1 %  Steroid medication - Left: 80 mg methylPREDNISolone acetate 40 MG/ML    Left Transforaminal Epidural Steroid Injection at the Left L5/S1 level and Left S1 foramen (left L5 and S1 nerve roots)     11/08/2023  Darren Owens, [id number]    Proceduralist:Icelynn Onken Alanson Aly, MD  Assistant: Raleigh Callas, DO    After the risks and benefits were explained a signed consent was obtained. All questions were answered in full. Entry site was marked by Dr. Alanson Aly.    The patient was brought into the fluoroscopy suite and was placed prone on the fluoroscopy table. A pillow was placed under the patient's abdomen to flatten the lumbar lordosis.  The lumbar and sacral region was prepped with chlorhexidine and was sterilely draped.      Final verification ("time out") was performed by Dr. Alanson Aly and the assistant to ensure the correct patient identity, site and agreement on the procedure to be done. I was in continuous attendance.     With the aid of the fluoroscope the targeted neural foramen were approached via a posterior oblique trajectory.  A second time out was performed at this point to re-confirm levels. The skin and subcutaneous tissues along the expected needle track were then anesthetized with 1% lidocaine. Then, a 22-gauge 5 inch spinal needle was advanced into the cephalad aspect of the targeted neural foramina. Appropriate needle tip position was confirmed on both AP and lateral images. Aspirate was confirmed negative for CSF and heme.    At this point, Omnipaque 240 mg per mL was injected and showed appropriate contrast spread along the targeted nerve root and into the epidural space. Then, 40  mg of Depo-Medrol and 1 mL of 1% lidocaine was injected at each targeted nerve root. The stylette was then replaced and the needle was removed. The procedure was well tolerated. EBL 0 mL. Gait WNL.     I, Dr. Alanson Aly, was present throughout the procedure.      Darren Colla, MD

## 2023-11-08 NOTE — Progress Notes (Signed)
Patient was examined prior to this procedure.      Change in health status: No      Interval change in structure, distribution quality of pain: No      Interval Change:   Medication Allergies:  No   Contrast Dye Allergies: No   Latex Allergies:  No   Current Antibiotics:  No   Current Blood Thinners: No   Recent Fevers/Chills:  No   History of Diabetes:  No   Current Steroids:  No   NPO:    N/A      Plan:    TFESI Left L5/S1 and S1 foramen

## 2023-11-08 NOTE — Patient Instructions (Signed)
[place] [facility]  Teaching Sheet  Trans-Foraminal Epidural Steroid Injection (Lumbar or Thoracic)  Prep for procedure:  Must have licensed, responsible, adult driver, or medical cab scheduled.   CAN eat/drink prior to procedure  Ok to take all medications as prescribed **unless your provider/RN has stated otherwise  Surgical mask once in building (we will provide if needed)  What to expect at my visit:  Arrive 30 mins PRIOR to procedure time  Discharge 15 mins after procedures complete   Done under x-ray: will locally numb the skin, then slowly advance needle into epidural space.   Difference between LESI: enters epidural space from the side where the nerve leaves the spine (different approach).  Done under x-ray: will locally numb the skin where injection is taking place, then slowly advance needle into epidural space. Contrast dye may be used to assure it's in the epidural space. Will then inject both numbing medication and steroid       Why am I having this?:  Spinal stenosis (narrowing in the spine), herniated disc, compression fractures, inflammatory conditions that cause acute radicular plain (pain that radiates from back or hip down to legs)   Thoracic surgery, abdominal surgery, rib fractures  CRPS (complex regional pain syndrome)  Can be for your: thoracic or lumbar areas   Post Procedure:  The local anesthetic medication provides a temporary numbing effect that may last from 1 - 2 hours to several hours  Steroid takes effect 2 to 5 days after the injection up to 10 days   Steroid should work for 3-4 months        Expected to be sore at injection site for a few days before the steroids have actually started working.  If you notice any infection at the site including redness, drainage, or new fever and any extremity weakness that lasts more than 3 days, please call our office.  Please notify the office if you notice:  Drainage, redness, or swelling of the injection site or  fever  Any prolonged dizziness or weakness  Severe headache that is worse when standing    Please contact the Pain Treatment Center at [phone] for any questions or concerns.    **If any meds change and if you are taking a blood thinner that has not been addressed, please contact us.   Some examples: Rivaroxaban (Xalerto), Apixaban (Eliquis),  Coumadin (warfarin), Lovenox (enoxaparin), Atrixtra (Fondaparinux), Plavix (Clopidogrel), Prasugrel (Effient), Ticagrelor (Brilinta), Dabigatran (Pradaxa)

## 2023-11-08 NOTE — Progress Notes (Signed)
Patient VSS and ready for discharge post procedure. Discharge instructions reviewed with patient and understanding confirmed. Discharge instructions available on MyChart. Patient walked to checkout.      Driver present: N/A  IV removed: N/A

## 2024-02-01 ENCOUNTER — Ambulatory Visit: Payer: No Typology Code available for payment source | Admitting: Pain Medicine

## 2024-04-09 ENCOUNTER — Other Ambulatory Visit
Admission: RE | Admit: 2024-04-09 | Discharge: 2024-04-09 | Disposition: A | Discharge status: Home / Self Care | Source: Ambulatory Visit | Attending: Family Medicine | Admitting: Family Medicine

## 2024-04-09 ENCOUNTER — Ambulatory Visit: Payer: Self-pay | Admitting: Family Medicine

## 2024-04-09 ENCOUNTER — Ambulatory Visit
Admission: RE | Admit: 2024-04-09 | Discharge: 2024-04-09 | Disposition: A | Discharge status: Home / Self Care | Source: Ambulatory Visit | Attending: Family Medicine | Admitting: Family Medicine

## 2024-04-09 ENCOUNTER — Other Ambulatory Visit: Payer: Self-pay | Admitting: Family Medicine

## 2024-04-09 ENCOUNTER — Other Ambulatory Visit: Payer: Self-pay

## 2024-04-09 DIAGNOSIS — R053 Chronic cough: Secondary | ICD-10-CM | POA: Insufficient documentation

## 2024-04-09 DIAGNOSIS — I10 Essential (primary) hypertension: Secondary | ICD-10-CM | POA: Insufficient documentation

## 2024-04-09 DIAGNOSIS — R7303 Prediabetes: Secondary | ICD-10-CM | POA: Insufficient documentation

## 2024-04-09 DIAGNOSIS — Z125 Encounter for screening for malignant neoplasm of prostate: Secondary | ICD-10-CM | POA: Insufficient documentation

## 2024-04-09 LAB — LIPID PANEL
Chol/HDL Ratio: 4
Cholesterol: 222 mg/dL — AB
HDL: 55 mg/dL (ref 40–60)
LDL Calculated: 137 mg/dL — AB
Non HDL Cholesterol: 167 mg/dL
Triglycerides: 168 mg/dL — AB

## 2024-04-09 LAB — PSA (EFF.4-2010): PSA (eff. 4-2010): 1.43 ng/mL (ref 0.00–4.00)

## 2024-04-09 LAB — BASIC METABOLIC PANEL
Anion Gap: 14 (ref 7–16)
CO2: 22 mmol/L (ref 20–28)
Calcium: 9.8 mg/dL (ref 8.6–10.2)
Chloride: 101 mmol/L (ref 96–108)
Creatinine: 1.47 mg/dL — ABNORMAL HIGH (ref 0.67–1.17)
Glucose: 110 mg/dL — ABNORMAL HIGH (ref 60–99)
Lab: 21 mg/dL — ABNORMAL HIGH (ref 6–20)
Potassium: 4.7 mmol/L (ref 3.3–5.1)
Sodium: 137 mmol/L (ref 133–145)
eGFR BY CREAT: 57 * — AB

## 2024-04-09 LAB — HEMOGLOBIN A1C: Hemoglobin A1C: 6.5 % — ABNORMAL HIGH

## 2024-04-09 NOTE — Result Encounter Note (Signed)
 Darren Owens [facility] patient, will address in their system

## 2024-04-09 NOTE — Result Encounter Note (Signed)
 [facility] patient, will address in their system

## 2024-06-04 ENCOUNTER — Telehealth: Payer: Self-pay | Admitting: Ophthalmology

## 2024-06-04 NOTE — Telephone Encounter (Signed)
 provider off/ lvm/ rsced/ sent letter/ bumped

## 2024-07-19 ENCOUNTER — Ambulatory Visit: Payer: Vision Other Private Insurance | Admitting: Ophthalmology

## 2024-08-01 ENCOUNTER — Encounter: Payer: Self-pay | Admitting: Optometrist

## 2024-08-01 ENCOUNTER — Ambulatory Visit: Payer: Vision Other Private Insurance | Attending: Optometrist | Admitting: Optometrist

## 2024-08-01 ENCOUNTER — Other Ambulatory Visit: Payer: Self-pay

## 2024-08-01 DIAGNOSIS — H52203 Unspecified astigmatism, bilateral: Secondary | ICD-10-CM | POA: Insufficient documentation

## 2024-08-01 DIAGNOSIS — H524 Presbyopia: Secondary | ICD-10-CM | POA: Insufficient documentation

## 2024-08-01 DIAGNOSIS — Z973 Presence of spectacles and contact lenses: Secondary | ICD-10-CM | POA: Insufficient documentation

## 2024-08-01 DIAGNOSIS — H5213 Myopia, bilateral: Secondary | ICD-10-CM | POA: Insufficient documentation

## 2024-08-01 NOTE — Progress Notes (Signed)
 Outpatient VisitPatient name: Darren Owens: 07-Dec-1970       Age: 53 y.o.MR#: [id number] Encounter Date: 9/3/2025Subjective: Chief Complaint Patient presents with  Annual Exam HPI  Darren Owens is a 53 y.o. male here for CEE.LEE: Dr Rayfield 1 yr ago Patient reports vision is unchanged for DVA and NVA OU. Patient reports: (-) flashes (-) floaters (-) pain.Ocular Meds: None Ocular Hx: Presbyopia Last edited by Elsie Deforest on 08/01/2024  9:54 AM.  has a current medication list which includes the following prescription(s): tadalafil , aspirin, clonidine, amlodipine-atorvastatin, losartan-hydrochlorothiazide, and methylprednisolone . is allergic to shellfish-derived products.  Past Medical History[1] Past Surgical History[2] Specialty Problems  None ROS  Positive for: Endocrine, EyesNegative for: Constitutional, Gastrointestinal, Neurological, Skin, Genitourinary, Musculoskeletal, HENT, Cardiovascular, Respiratory, Psychiatric, Allergic/Imm, Heme/LymphLast edited by Shellia Meadow, OD on 08/01/2024 10:12 AM.   Objective: Base Eye Exam   Visual Acuity (Snellen - Linear)     Right Left  Dist cc 20/25 -1 20/25 -1  Near cc J2 J3  Correction: Contacts    Tonometry (Tonopen, 10:05 AM)     Right Left  Pressure 14 13    Pupils     Dark Light Shape React APD  Right 4 3 Round Brisk None  Left 4 3 Round Brisk None    Visual Fields (Counting fingers)     Left Right   Full Full    Extraocular Movement     Right Left   Full Full    Neuro/Psych   Oriented x3: Yes  Mood/Affect: Normal    Dilation   Both eyes: 1.0% Tropicamide, 0.5% Proparacaine @ 10:05 AM    Slit Lamp and Fundus Exam   External Exam     Right Left  External Normal ocular adnexae, lacrimal gland & drainage, orbits Normal ocular  adnexae, lacrimal gland & drainage, orbits    Slit Lamp Exam     Right Left  Lids/Lashes Normal structure & position Normal structure & position  Conjunctiva/Sclera Normal bulbar/palpebral, conjunctiva, sclera Normal bulbar/palpebral, conjunctiva, sclera  Cornea Normal epithelium, stroma, endothelium, tear film Normal epithelium, stroma, endothelium, tear film  Anterior Chamber Clear & deep Clear & deep  Iris Normal shape, size, morphology Normal shape, size, morphology  Lens Normal cortex, nucleus, anterior/posterior capsule, clarity Normal cortex, nucleus, anterior/posterior capsule, clarity  Anterior Vitreous Clear Clear    Fundus Exam     Right Left  Disc Normal size, appearance, nerve fiber layer Normal size, appearance, nerve fiber layer  C/D Ratio 0.3 0.4  Macula Normal Normal  Vessels Normal Normal  Periphery Normal Normal    Refraction   Wearing Rx     Sphere Cylinder Axis Add  Right -1.50 -2.75 114 +2.00  Left -4.50 -1.25 060 +2.00  Type: PAL    Manifest Refraction     Sphere Cylinder Axis Dist [place] Add [place]  Right -1.75 -2.75 110 20/20 +2.00 J1+  Left -4.00 -1.50 065 20/20 +2.00 OU    Final Rx     Sphere Cylinder Axis Add  Right -1.75 -2.75 110 +2.00  Left -4.00 -1.50 065 +2.00  Expiration Date: 08/01/2025    Contact Lens Exam   Current Contact Lens Rx     Brand Base Curve Diameter Sphere Cylinder Axis Add Centration Movement  Right Biofinity Toric Multifocal 8.7 14.5 -1.50 -2.75 110 +1.50 D Well centered Appropriate  Left Biofinity Toric Multifocal 8.7 14.5 -4.00 -1.25 080 +1.50 N Well centered Appropriate    Contact History   AWT: 12 hours  WTT: 3  hours  Replacement Frequency: Monthly  Solutions Used: BioTrue  Lens Age: month    Final Contact Lens Rx     Brand Base Curve Diameter Sphere Cylinder Axis Add  Right Biofinity  Toric Multifocal 8.7 14.5 -1.50 -2.75 110 +1.50 D  Left Biofinity Toric Multifocal 8.7 14.5 -4.00 -1.25 080 +1.50 N  Expiration Date: 08/01/2025  Replacement: Monthly  Wearing Schedule: Daily Wear Advised to take Cls out 2-3 hrs before bed and wear specs    Final Rx     Sphere Cylinder Axis Add  Right -1.75 -2.75 110 +2.00  Left -4.00 -1.50 065 +2.00  Expiration Date: 08/01/2025  Final Contact Lens Rx     Brand Base Curve Diameter Sphere Cylinder Axis Add  Right Biofinity Toric Multifocal 8.7 14.5 -1.50 -2.75 110 +1.50 D  Left Biofinity Toric Multifocal 8.7 14.5 -4.00 -1.25 080 +1.50 N  Expiration Date: 08/01/2025  Replacement: Monthly  Wearing Schedule: Daily Wear Advised to take Cls out 2-3 hrs before bed and wear specs    Assessment/Plan:  1. 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 Wears contact lenses     PLAN:Spectacle Rx Given today. Educated about adaptation period with new specs .  Various lenses options explained  Educated about limitations with MF torics . Cls checked. Fitting well. Rx finalised and given to patient Educated about signs and symptoms of retina break . Advised to RTC or see an ophthalmology clinic within 12-48 hrs if symptoms occur .RTC 12 months for follow up or sooner if vision worsons or new symptoms develop   Educated about signs and symptoms of retina break . Advised to RTC or see an ophthalmology clinic within 12-48 hrs if symptoms occur  [1] Past Medical History:Diagnosis Date  Hypertension  [2] Past Surgical History:Procedure Laterality Date  CERVICAL SPINE SURGERY    PR COLONOSCOPY FLX DX W/COLLJ SPEC WHEN PFRMD N/A 11/20/2019  Procedure: COLONOSCOPY;  Surgeon: Elige Dorn FERNS, MD;  Location: [facility];  Service: GI

## 2025-08-02 ENCOUNTER — Ambulatory Visit: Admitting: Optometrist

## 4910-03-29 DEATH — deceased
